# Patient Record
Sex: Female | Born: 1975
Health system: Southern US, Community
[De-identification: ages and names within clinical notes are randomized; demographics above are authoritative.]

## PROBLEM LIST (undated history)

## (undated) DIAGNOSIS — R87619 Unspecified abnormal cytological findings in specimens from cervix uteri: Secondary | ICD-10-CM

## (undated) DIAGNOSIS — G43909 Migraine, unspecified, not intractable, without status migrainosus: Secondary | ICD-10-CM

## (undated) DIAGNOSIS — F419 Anxiety disorder, unspecified: Secondary | ICD-10-CM

## (undated) HISTORY — PX: ANTERIOR CRUCIATE LIGAMENT REPAIR: SHX115

## (undated) HISTORY — DX: Migraine, unspecified, not intractable, without status migrainosus: G43.909

## (undated) HISTORY — DX: Anxiety disorder, unspecified: F41.9

## (undated) HISTORY — DX: Unspecified abnormal cytological findings in specimens from cervix uteri: R87.619

---
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
## 1999-07-01 DIAGNOSIS — R87619 Unspecified abnormal cytological findings in specimens from cervix uteri: Secondary | ICD-10-CM

## 1999-07-01 HISTORY — DX: Unspecified abnormal cytological findings in specimens from cervix uteri: R87.619

## 1999-07-01 HISTORY — PX: GYNECOLOGIC CRYOSURGERY: SHX857

## 2006-12-07 DIAGNOSIS — B360 Pityriasis versicolor: Secondary | ICD-10-CM | POA: Insufficient documentation

## 2011-08-24 ENCOUNTER — Ambulatory Visit (INDEPENDENT_AMBULATORY_CARE_PROVIDER_SITE_OTHER): Payer: 59 | Admitting: Emergency Medicine

## 2011-08-24 VITALS — BP 104/68 | HR 88 | Temp 98.5°F | Resp 12 | Ht 66.0 in | Wt 134.0 lb

## 2011-08-24 DIAGNOSIS — B353 Tinea pedis: Secondary | ICD-10-CM

## 2011-08-24 DIAGNOSIS — R634 Abnormal weight loss: Secondary | ICD-10-CM

## 2011-08-24 DIAGNOSIS — H66019 Acute suppurative otitis media with spontaneous rupture of ear drum, unspecified ear: Secondary | ICD-10-CM

## 2011-08-24 DIAGNOSIS — R5381 Other malaise: Secondary | ICD-10-CM

## 2011-08-24 DIAGNOSIS — B373 Candidiasis of vulva and vagina: Secondary | ICD-10-CM

## 2011-08-24 DIAGNOSIS — B3731 Acute candidiasis of vulva and vagina: Secondary | ICD-10-CM

## 2011-08-24 DIAGNOSIS — H6690 Otitis media, unspecified, unspecified ear: Secondary | ICD-10-CM

## 2011-08-24 MED ORDER — CEFDINIR 300 MG PO CAPS: 300.0000 mg | ORAL_CAPSULE | Freq: Two times a day (BID) | ORAL | Status: AC

## 2011-08-24 MED ORDER — OFLOXACIN 0.3 % OT SOLN: 5.0000 [drp] | Freq: Two times a day (BID) | OTIC | Status: AC

## 2011-08-24 NOTE — Patient Instructions (Signed)

## 2011-08-24 NOTE — Progress Notes (Signed)
  Subjective:    Patient ID: Linda Franklin, female    DOB: 03/17/1976, 36 y.o.   MRN: [id number]  HPI said in mid January she was treated for a strep infection. Following this she has had some upper respiratory type congestion. She subsequently developed left ear pain. Last night she noticed a purulent drainage from her left ear.    Review of Systems specifically she denies cough cardiac or GI symptoms.     Objective:   Physical Exam the right TM is normal the left TM is perforated TM itself is bulging and red. There is a purulent drainage from the left TM.        Assessment & Plan:  Assessment is left otitis media with spontaneous perforation secondary to infection

## 2012-01-21 ENCOUNTER — Ambulatory Visit: Payer: 59 | Admitting: Physician Assistant

## 2012-01-21 ENCOUNTER — Encounter: Payer: Self-pay | Admitting: Physician Assistant

## 2012-01-21 VITALS — BP 98/70 | HR 80 | Temp 98.4°F | Resp 16 | Ht 65.5 in | Wt 131.6 lb

## 2012-01-21 DIAGNOSIS — Z0000 Encounter for general adult medical examination without abnormal findings: Secondary | ICD-10-CM

## 2012-01-21 NOTE — Progress Notes (Signed)
  Subjective:    Patient ID: Linda Franklin, female    DOB: 18-Mar-1976, 36 y.o.   MRN: [id number]  HPI Pt needs form for [facility] filled out.  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  She has had a TB skin test within the last year.  Her CPE was 9/12 and normal and she is having no problems.   Review of Systems     Objective:   Physical Exam        Assessment & Plan:  Pt form was filled out.  No appt was necessary.

## 2012-01-21 NOTE — Progress Notes (Signed)
  Subjective:    Patient ID: Linda Franklin, female    DOB: 10-05-1975, 36 y.o.   MRN: [id number]  HPI    Review of Systems  Constitutional: Negative.   HENT: Negative.   Eyes: Negative.   Respiratory: Negative.   Cardiovascular: Negative.   Gastrointestinal: Negative.   Genitourinary: Negative.   Musculoskeletal: Negative.   Skin: Negative.   Neurological: Negative.   Hematological: Negative.   Psychiatric/Behavioral: Negative.        Objective:   Physical Exam        Assessment & Plan:

## 2012-07-13 ENCOUNTER — Ambulatory Visit (INDEPENDENT_AMBULATORY_CARE_PROVIDER_SITE_OTHER): Payer: BC Managed Care – PPO | Admitting: Family Medicine

## 2012-07-13 ENCOUNTER — Encounter: Payer: Self-pay | Admitting: Family Medicine

## 2012-07-13 VITALS — BP 98/66 | HR 69 | Temp 98.2°F | Resp 16 | Ht 68.75 in | Wt 135.0 lb

## 2012-07-13 DIAGNOSIS — Z0289 Encounter for other administrative examinations: Secondary | ICD-10-CM

## 2012-07-13 DIAGNOSIS — Z0000 Encounter for general adult medical examination without abnormal findings: Secondary | ICD-10-CM | POA: Insufficient documentation

## 2012-07-13 DIAGNOSIS — Z111 Encounter for screening for respiratory tuberculosis: Secondary | ICD-10-CM

## 2012-07-13 LAB — POCT URINALYSIS DIPSTICK
Bilirubin, UA: NEGATIVE
Ketones, UA: NEGATIVE
Leukocytes, UA: NEGATIVE
Nitrite, UA: NEGATIVE
pH, UA: 6

## 2012-07-13 NOTE — Progress Notes (Signed)
  Subjective:    Patient ID: Linda Franklin, female    DOB: [birth year], 37 y.o.   MRN: [id number]  HPI  This healthy 37 y.o. Cauc female is here for [facility] physical exam. 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 No chronic medical issues have been identified. She stays physically active with jogging twice a week. Immunizations are UTD. She has annual vision evaluation because she wears contacts. Last PAP was 2012 (normal).    Review of Systems  Constitutional: Negative.   HENT: Positive for tinnitus.   Eyes: Negative.   Respiratory: Negative.   Cardiovascular: Negative.   Gastrointestinal: Negative.   Genitourinary: Negative.   Musculoskeletal: Negative.   Skin: Negative.   Neurological: Negative.   Hematological: Negative.   Psychiatric/Behavioral: Negative.        Objective:   Physical Exam  Nursing note and vitals reviewed. Constitutional: She is oriented to person, place, and time. Vital signs are normal. She appears well-developed and well-nourished. No distress.  HENT:  Head: Normocephalic and atraumatic.  Right Ear: Hearing, tympanic membrane, external ear and ear canal normal.  Left Ear: Hearing, tympanic membrane, external ear and ear canal normal.  Nose: Nose normal. No nasal deformity or septal deviation.  Mouth/Throat: Uvula is midline, oropharynx is clear and moist and mucous membranes are normal. No oral lesions. Normal dentition. No dental caries.  Eyes: Conjunctivae normal and EOM are normal. Pupils are equal, round, and reactive to light. Right eye exhibits no discharge. Left eye exhibits no discharge. No scleral icterus.  Neck: Normal range of motion. Neck supple. No thyromegaly present.  Cardiovascular: Normal rate, regular rhythm and normal heart sounds.  Exam reveals no gallop and no friction rub.   No murmur heard. Pulmonary/Chest: Effort normal and breath sounds normal.  Abdominal: Soft. Bowel sounds are normal. She exhibits no distension  and no mass. There is no hepatosplenomegaly. There is no tenderness. There is no guarding and no CVA tenderness.  Musculoskeletal: Normal range of motion. She exhibits no edema and no tenderness.  Lymphadenopathy:    She has no cervical adenopathy.  Neurological: She is alert and oriented to person, place, and time. She has normal reflexes. No cranial nerve deficit. She exhibits normal muscle tone. Coordination normal.  Skin: Skin is warm and dry. No rash noted. No erythema. No pallor.  Psychiatric: She has a normal mood and affect. Her behavior is normal. Judgment and thought content normal.    Results for orders placed in visit on 07/13/12  POCT URINALYSIS DIPSTICK      Component Value Range   Color, UA yellow     Clarity, UA clear     Glucose, UA neg     Bilirubin, UA neg     Ketones, UA neg     Spec Grav, UA 1.025     Blood, UA neg     pH, UA 6.0     Protein, UA neg     Urobilinogen, UA 0.2     Nitrite, UA neg     Leukocytes, UA Negative           Assessment & Plan:   1. Other general medical examination for administrative purposes  POCT urinalysis dipstick  2. Screening for tuberculosis  TB Skin Test Pt will return in 48-72 hours to have PPD read.

## 2012-07-13 NOTE — Patient Instructions (Signed)
Your exam was normal today. A PPD (TB skin test) was placed today. You will need to return to [facility] in 48-72 hours to have it looked at; you will receive your form at that time.

## 2012-07-15 ENCOUNTER — Encounter (INDEPENDENT_AMBULATORY_CARE_PROVIDER_SITE_OTHER): Payer: BC Managed Care – PPO

## 2012-07-15 DIAGNOSIS — Z111 Encounter for screening for respiratory tuberculosis: Secondary | ICD-10-CM

## 2012-07-15 LAB — TB SKIN TEST: TB Skin Test: NEGATIVE

## 2013-05-05 ENCOUNTER — Other Ambulatory Visit: Payer: Self-pay

## 2014-07-03 ENCOUNTER — Ambulatory Visit (INDEPENDENT_AMBULATORY_CARE_PROVIDER_SITE_OTHER): Payer: BLUE CROSS/BLUE SHIELD | Admitting: Nurse Practitioner

## 2014-07-03 ENCOUNTER — Encounter: Payer: Self-pay | Admitting: Nurse Practitioner

## 2014-07-03 VITALS — BP 100/60 | HR 68 | Ht 65.5 in | Wt 136.0 lb

## 2014-07-03 DIAGNOSIS — Z01419 Encounter for gynecological examination (general) (routine) without abnormal findings: Secondary | ICD-10-CM

## 2014-07-03 DIAGNOSIS — Z0000 Encounter for general adult medical examination without abnormal findings: Secondary | ICD-10-CM

## 2014-07-03 LAB — POCT URINALYSIS DIPSTICK
Bilirubin, UA: NEGATIVE
GLUCOSE UA: NEGATIVE
Ketones, UA: NEGATIVE
Leukocytes, UA: NEGATIVE
Nitrite, UA: NEGATIVE
PH UA: 5
PROTEIN UA: NEGATIVE
RBC UA: NEGATIVE
UROBILINOGEN UA: NEGATIVE

## 2014-07-03 NOTE — Patient Instructions (Addendum)

## 2014-07-03 NOTE — Progress Notes (Signed)
Patient ID: Linda Franklin, female   DOB: 1975-10-27, 39 y.o.   MRN: [id number] 39 y.o. G64P2002 Married Caucasian Fe here for NGYN annual exam.  Menses are normal last 4 days.  Moderate to light, moderate cramps, some PMS, breast tenderness.  Moved here in 2010 for husbands job.  She is working for [employer].  Occasionally has stress incontinence with jogging.  Patient's last menstrual period was 06/17/2014 (exact date).          Sexually active: Yes.    The current method of family planning is vasectomy.    Exercising: Yes.    Home exercise routine includes yoga and jogging on weekends. Smoker:  no  Health Maintenance: Pap:  2012?, [facility] MMG:  Never  TDaP:  12/17/09 Labs:  HB:  15.2  Urine:  negative   reports that she has never smoked. She has never used smokeless tobacco. She reports that she drinks alcohol. She reports that she does not use illicit drugs.  Past Medical History  Diagnosis Date  . Abnormal Pap smear of cervix 2001    ? HPV, cryo, normal since    Past Surgical History  Procedure Laterality Date  . Anterior cruciate ligament repair Left 2004  . Gynecologic cryosurgery  2001    Cervix, abnormal pap  . Mouth surgery  1990    teeth exraction for brace    No current outpatient prescriptions on file.   No current facility-administered medications for this visit.    Family History  Problem Relation Age of Onset  . Hypertension Mother   . Hyperlipidemia Mother   . Heart disease Maternal Grandfather   . Heart attack Maternal Grandfather   . Cancer Paternal Grandmother     brain  . Alcohol abuse Paternal Grandfather   . Heart failure Paternal Grandfather   . Diabetes Paternal Grandfather   . Diabetes Father     ROS:  Pertinent items are noted in HPI.  Otherwise, a comprehensive ROS was negative.  Exam:   BP 100/60 mmHg  Pulse 68  Ht 5' 5.5" (1.664 m)  Wt 136 lb (61.689 kg)  BMI 22.28 kg/m2  LMP 06/17/2014 (Exact Date) Height: 5' 5.5" (166.4 cm)  Ht Readings from  Last 3 Encounters:  07/03/14 5' 5.5" (1.664 m)  07/13/12 5' 8.75" (1.746 m)  01/21/12 5' 5.5" (1.664 m)    General appearance: alert, cooperative and appears stated age Head: Normocephalic, without obvious abnormality, atraumatic Neck: no adenopathy, supple, symmetrical, trachea midline and thyroid normal to inspection and palpation Lungs: clear to auscultation bilaterally Breasts: normal appearance, no masses or tenderness Heart: regular rate and rhythm Abdomen: soft, non-tender; no masses,  no organomegaly Extremities: extremities normal, atraumatic, no cyanosis or edema Skin: Skin color, texture, turgor normal. No rashes or lesions Lymph nodes: Cervical, supraclavicular, and axillary nodes normal. No abnormal inguinal nodes palpated Neurologic: Grossly normal   Pelvic: External genitalia:  no lesions              Urethra:  normal appearing urethra with no masses, tenderness or lesions              Bartholin's and Skene's: normal                 Vagina: normal appearing vagina with normal color and discharge, no lesions              Cervix: anteverted              Pap taken: Yes.  Bimanual Exam:  Uterus:  normal size, contour, position, consistency, mobility, non-tender              Adnexa: no mass, fullness, tenderness               Rectovaginal: Confirms               Anus:  normal sphincter tone, no lesions  A:  Well Woman with normal exam  Husband with vasectomy  Normal menses  Occasional incontinence with running  Abnormal pap 2001 with cryo for HPV  P:   Reviewed health and wellness pertinent to exam  Pap smear taken today  Counseled on breast self exam, adequate intake of calcium and vitamin D, diet and exercise, Kegel's exercises return annually or prn  An After Visit Summary was printed and given to the patient.  Names of PCP - internal medicine given

## 2014-07-04 LAB — HEMOGLOBIN, FINGERSTICK: HEMOGLOBIN, FINGERSTICK: 15.2 g/dL (ref 12.0–16.0)

## 2014-07-04 NOTE — Progress Notes (Signed)
Encounter reviewed by Dr. Lenward Able Silva.  

## 2014-07-05 LAB — IPS PAP TEST WITH HPV

## 2015-07-09 ENCOUNTER — Ambulatory Visit: Payer: BLUE CROSS/BLUE SHIELD | Admitting: Nurse Practitioner

## 2015-07-30 ENCOUNTER — Telehealth: Payer: Self-pay | Admitting: Nurse Practitioner

## 2015-07-30 NOTE — Telephone Encounter (Signed)
left message for appt/rd

## 2015-07-31 ENCOUNTER — Ambulatory Visit (INDEPENDENT_AMBULATORY_CARE_PROVIDER_SITE_OTHER): Payer: BLUE CROSS/BLUE SHIELD | Admitting: Nurse Practitioner

## 2015-07-31 ENCOUNTER — Encounter: Payer: Self-pay | Admitting: Nurse Practitioner

## 2015-07-31 VITALS — BP 96/60 | HR 64 | Ht 65.5 in | Wt 125.0 lb

## 2015-07-31 DIAGNOSIS — Z0000 Encounter for general adult medical examination without abnormal findings: Secondary | ICD-10-CM

## 2015-07-31 DIAGNOSIS — Z01419 Encounter for gynecological examination (general) (routine) without abnormal findings: Secondary | ICD-10-CM

## 2015-07-31 DIAGNOSIS — N760 Acute vaginitis: Secondary | ICD-10-CM

## 2015-07-31 NOTE — Patient Instructions (Addendum)

## 2015-07-31 NOTE — Progress Notes (Signed)
Encounter reviewed by Dr. Brook Amundson C. Silva.  

## 2015-07-31 NOTE — Progress Notes (Signed)
Patient ID: Linda Franklin, female   DOB: 03/26/1976, 40 y.o.   MRN: [id number]  40 y.o. G3P2002 Married  Caucasian Fe here for annual exam.  Menses last 4 days. Moderate to light with mild cramps.  Some PMS.  Some vaginal discharge that is ongoing and sometimes worse with cycles.  Patient's last menstrual period was 07/04/2015 (exact date).          Sexually active: Yes.    The current method of family planning is vasectomy.    Exercising: Yes.    jogging Smoker:  no  Health Maintenance: Pap:  07/03/14, negative with neg HR HPV TDaP:  6 /20/11  HIV: 07/01/03 (pregnancy) Labs: HB: 15.3   Urine: negative   reports that she has never smoked. She has never used smokeless tobacco. She reports that she drinks alcohol. She reports that she does not use illicit drugs.  Past Medical History  Diagnosis Date  . Abnormal Pap smear of cervix 2001    ? HPV, cryo, normal since    Past Surgical History  Procedure Laterality Date  . Anterior cruciate ligament repair Left 2004  . Gynecologic cryosurgery  2001    Cervix, abnormal pap  . Mouth surgery  1990    teeth exraction for brace    No current outpatient prescriptions on file.   No current facility-administered medications for this visit.    Family History  Problem Relation Age of Onset  . Hypertension Mother   . Hyperlipidemia Mother   . Heart disease Maternal Grandfather   . Heart attack Maternal Grandfather   . Cancer Paternal Grandmother     brain  . Alcohol abuse Paternal Grandfather   . Heart failure Paternal Grandfather   . Diabetes Paternal Grandfather   . Diabetes Father     ROS:  Pertinent items are noted in HPI.  Otherwise, a comprehensive ROS was negative.  Exam:   BP 96/60 mmHg  Pulse 64  Ht 5' 5.5" (1.664 m)  Wt 125 lb (56.7 kg)  BMI 20.48 kg/m2  LMP 07/04/2015 (Exact Date) Height: 5' 5.5" (166.4 cm) Ht Readings from Last 3 Encounters:  07/31/15 5' 5.5" (1.664 m)  07/03/14 5' 5.5" (1.664 m)  07/13/12 5' 8.75"  (1.746 m)    General appearance: alert, cooperative and appears stated age Head: Normocephalic, without obvious abnormality, atraumatic Neck: no adenopathy, supple, symmetrical, trachea midline and thyroid normal to inspection and palpation Lungs: clear to auscultation bilaterally Breasts: normal appearance, no masses or tenderness Heart: regular rate and rhythm Abdomen: soft, non-tender; no masses,  no organomegaly Extremities: extremities normal, atraumatic, no cyanosis or edema Skin: Skin color, texture, turgor normal. No rashes or lesions Lymph nodes: Cervical, supraclavicular, and axillary nodes normal. No abnormal inguinal nodes palpated Neurologic: Grossly normal   Pelvic: External genitalia:  no lesions              Urethra:  normal appearing urethra with no masses, tenderness or lesions              Bartholin's and Skene's: normal                 Vagina: normal appearing vagina with normal color and discharge, no lesions              Cervix: anteverted              Pap taken: No. Bimanual Exam:  Uterus:  normal size, contour, position, consistency, mobility, non-tender  Adnexa: no mass, fullness, tenderness               Rectovaginal: Confirms               Anus:  normal sphincter tone, no lesions  Chaperone present: yes  A:  Well Woman with normal exam  Husband with vasectomy Normal menses Occasional incontinence with running Abnormal pap 2001 with cryo for HPV  R/O vaginitis   P:   Reviewed health and wellness pertinent to exam  Pap smear as above  Will follow with Affirm  Counseled on breast self exam, mammography screening, adequate intake of calcium and vitamin D, diet and exercise return annually or prn  An After Visit Summary was printed and given to the patient.

## 2015-08-01 ENCOUNTER — Other Ambulatory Visit: Payer: Self-pay | Admitting: Nurse Practitioner

## 2015-08-01 LAB — WET PREP BY MOLECULAR PROBE
Candida species: NEGATIVE
GARDNERELLA VAGINALIS: POSITIVE — AB
Trichomonas vaginosis: NEGATIVE

## 2015-08-01 MED ORDER — METRONIDAZOLE 0.75 % VA GEL: 1.0000 | Freq: Every day | VAGINAL | Status: DC

## 2015-08-01 MED ORDER — FLUCONAZOLE 150 MG PO TABS: 150.0000 mg | ORAL_TABLET | Freq: Once | ORAL | Status: DC

## 2015-08-02 LAB — HEMOGLOBIN, FINGERSTICK: Hemoglobin, fingerstick: 15.3 g/dL (ref 12.0–16.0)

## 2015-08-15 ENCOUNTER — Ambulatory Visit (INDEPENDENT_AMBULATORY_CARE_PROVIDER_SITE_OTHER): Payer: BLUE CROSS/BLUE SHIELD | Admitting: Nurse Practitioner

## 2015-08-15 ENCOUNTER — Encounter: Payer: Self-pay | Admitting: Nurse Practitioner

## 2015-08-15 VITALS — BP 114/76 | HR 92 | Temp 97.6°F | Ht 65.5 in | Wt 127.0 lb

## 2015-08-15 DIAGNOSIS — Z113 Encounter for screening for infections with a predominantly sexual mode of transmission: Secondary | ICD-10-CM

## 2015-08-15 DIAGNOSIS — N760 Acute vaginitis: Secondary | ICD-10-CM

## 2015-08-15 MED ORDER — FLUCONAZOLE 150 MG PO TABS: 150.0000 mg | ORAL_TABLET | Freq: Once | ORAL | Status: DC

## 2015-08-15 MED ORDER — NYSTATIN-TRIAMCINOLONE 100000-0.1 UNIT/GM-% EX OINT: 1.0000 "application " | TOPICAL_OINTMENT | Freq: Two times a day (BID) | CUTANEOUS | Status: DC

## 2015-08-15 NOTE — Progress Notes (Signed)
39 y.o. Married Caucasian female G2P2002 here with complaint of vaginal symptoms of itching, burning, and increase discharge. Describes discharge as thick and white. Onset of symptoms 5 days ago. Denies new personal products or vaginal dryness. no STD concerns. Urinary symptoms none . Contraception is vasectomy.  She has recently torn her left meniscus again and will be having surgery 3/ 2017.  In January she had BV and did have a bad yeast infection afterwards that she treated with Diflucan.   O:  Healthy female WDWN Affect: normal, orientation x 3  Exam: alert, some discomfort at the vulva Abdomen: soft and non tender Lymph node: no enlargement or tenderness Pelvic exam: External genital: normal female BUS: negative Vagina: very copious amounts of thick white discharge noted.  Affirm taken. Cervix: normal, non tender, no CMT Uterus: normal, non tender Adnexa:normal, non tender, no masses or fullness noted   A: Vaginitis - most likely yeast  R/O STD's   P: Discussed findings of vaginitis and etiology. Discussed Aveeno or baking soda sitz bath for comfort. Avoid moist clothes or pads for extended period of time. If working out in gym clothes or swim suits for long periods of time change underwear or bottoms of swimsuit if possible. Olive Oil/Coconut Oil use for skin protection prior to activity can be used to external skin.  Rx: Diflucan 150 mg # 2 with refills in case she is on antibiotics after knee surgery upcoming  Will give her Triamcinolone and Nystatin to use BID for discomfort  Follow with Affirm  RV prn

## 2015-08-15 NOTE — Patient Instructions (Signed)

## 2015-08-16 ENCOUNTER — Telehealth: Payer: Self-pay | Admitting: Nurse Practitioner

## 2015-08-16 LAB — WET PREP BY MOLECULAR PROBE
CANDIDA SPECIES: NEGATIVE
Gardnerella vaginalis: NEGATIVE
Trichomonas vaginosis: NEGATIVE

## 2015-08-16 LAB — STD PANEL
HEP B S AG: NEGATIVE
HIV 1&2 Ab, 4th Generation: NONREACTIVE

## 2015-08-16 NOTE — Telephone Encounter (Signed)
Return call to patient. Advised of negative Affirm and HIV, RPR and Hep B testing. See result note from Debbi Darcel Bayley CNM covering for Shirlyn Goltz FNP. Patient  concerned about negative testing for yeast since she is being treated for yeast, concerned that maybe this means she has Gonorrhea or Chlamydia and asking for these results. Advised these test results are still pending. Advised that sometimes contact type reaction can be similar to yeast infection. Has RX for Triamcinolone cream.  Patient very anxious for these results and requests call back ASAP since was expecting these results today.    Routing to Dr Hyacinth Meeker covering for [place] while out of office.  CC: Shirlyn Goltz, FNP

## 2015-08-16 NOTE — Progress Notes (Signed)
Encounter reviewed by Dr. Wandell Scullion Amundson C. Silva.  

## 2015-08-16 NOTE — Telephone Encounter (Signed)
Patient is calling to see if her results are in yet. She is worried because she was told her labs would be back today.

## 2015-08-16 NOTE — Addendum Note (Signed)
Addended by: Jerene Bears on: 08/16/2015 04:39 PM   Modules accepted: Orders

## 2015-08-17 NOTE — Telephone Encounter (Signed)
Pt notified of negative results for Affirm and blood work.  GC/Chl pending.  Order was placed.  Ok to close encounter.

## 2015-08-20 ENCOUNTER — Telehealth: Payer: Self-pay | Admitting: *Deleted

## 2015-08-20 LAB — IPS N GONORRHOEA AND CHLAMYDIA BY PCR

## 2015-08-20 NOTE — Telephone Encounter (Signed)
Since she is still symptomatic have her to take the other Diflucan.

## 2015-08-20 NOTE — Telephone Encounter (Signed)
Notified patient of recommendation from Shirlyn Goltz, FNP.  Pt is agreeable.  She will get refill and call us if needed.

## 2015-08-20 NOTE — Telephone Encounter (Signed)
Patient states she is still having discharge and external vaginal irritation.  Symptoms are improved, but not resolved.  Still has thick discharge.  Pt took Diflucan on 2/15 and 2/17.  Wants to know if she should take second round of Diflucan now or wait until after surgery.  Please advise.

## 2015-09-10 DIAGNOSIS — Z9889 Other specified postprocedural states: Secondary | ICD-10-CM | POA: Insufficient documentation

## 2015-10-01 DIAGNOSIS — M25662 Stiffness of left knee, not elsewhere classified: Secondary | ICD-10-CM | POA: Diagnosis not present

## 2015-10-01 DIAGNOSIS — R269 Unspecified abnormalities of gait and mobility: Secondary | ICD-10-CM | POA: Diagnosis not present

## 2015-10-01 DIAGNOSIS — M25462 Effusion, left knee: Secondary | ICD-10-CM | POA: Diagnosis not present

## 2015-10-01 DIAGNOSIS — Z9889 Other specified postprocedural states: Secondary | ICD-10-CM | POA: Diagnosis not present

## 2015-10-02 DIAGNOSIS — R269 Unspecified abnormalities of gait and mobility: Secondary | ICD-10-CM | POA: Diagnosis not present

## 2015-10-02 DIAGNOSIS — S83212D Bucket-handle tear of medial meniscus, current injury, left knee, subsequent encounter: Secondary | ICD-10-CM | POA: Diagnosis not present

## 2015-10-02 DIAGNOSIS — Z9889 Other specified postprocedural states: Secondary | ICD-10-CM | POA: Diagnosis not present

## 2015-10-02 DIAGNOSIS — M25562 Pain in left knee: Secondary | ICD-10-CM | POA: Diagnosis not present

## 2015-10-09 DIAGNOSIS — S83212D Bucket-handle tear of medial meniscus, current injury, left knee, subsequent encounter: Secondary | ICD-10-CM | POA: Diagnosis not present

## 2015-10-09 DIAGNOSIS — R269 Unspecified abnormalities of gait and mobility: Secondary | ICD-10-CM | POA: Diagnosis not present

## 2015-10-09 DIAGNOSIS — M25562 Pain in left knee: Secondary | ICD-10-CM | POA: Diagnosis not present

## 2015-10-09 DIAGNOSIS — Z9889 Other specified postprocedural states: Secondary | ICD-10-CM | POA: Diagnosis not present

## 2015-10-10 DIAGNOSIS — S83212D Bucket-handle tear of medial meniscus, current injury, left knee, subsequent encounter: Secondary | ICD-10-CM | POA: Diagnosis not present

## 2015-10-10 DIAGNOSIS — R269 Unspecified abnormalities of gait and mobility: Secondary | ICD-10-CM | POA: Diagnosis not present

## 2015-10-10 DIAGNOSIS — Z9889 Other specified postprocedural states: Secondary | ICD-10-CM | POA: Diagnosis not present

## 2015-10-10 DIAGNOSIS — M25562 Pain in left knee: Secondary | ICD-10-CM | POA: Diagnosis not present

## 2015-10-14 DIAGNOSIS — N390 Urinary tract infection, site not specified: Secondary | ICD-10-CM | POA: Diagnosis not present

## 2015-10-16 DIAGNOSIS — M25562 Pain in left knee: Secondary | ICD-10-CM | POA: Diagnosis not present

## 2015-10-16 DIAGNOSIS — R269 Unspecified abnormalities of gait and mobility: Secondary | ICD-10-CM | POA: Diagnosis not present

## 2015-10-16 DIAGNOSIS — S83212D Bucket-handle tear of medial meniscus, current injury, left knee, subsequent encounter: Secondary | ICD-10-CM | POA: Diagnosis not present

## 2015-10-16 DIAGNOSIS — Z9889 Other specified postprocedural states: Secondary | ICD-10-CM | POA: Diagnosis not present

## 2015-10-19 DIAGNOSIS — M25562 Pain in left knee: Secondary | ICD-10-CM | POA: Diagnosis not present

## 2015-10-19 DIAGNOSIS — Z9889 Other specified postprocedural states: Secondary | ICD-10-CM | POA: Diagnosis not present

## 2015-10-19 DIAGNOSIS — R269 Unspecified abnormalities of gait and mobility: Secondary | ICD-10-CM | POA: Diagnosis not present

## 2015-10-19 DIAGNOSIS — S83212D Bucket-handle tear of medial meniscus, current injury, left knee, subsequent encounter: Secondary | ICD-10-CM | POA: Diagnosis not present

## 2015-10-23 DIAGNOSIS — Z9889 Other specified postprocedural states: Secondary | ICD-10-CM | POA: Diagnosis not present

## 2015-10-23 DIAGNOSIS — S83212D Bucket-handle tear of medial meniscus, current injury, left knee, subsequent encounter: Secondary | ICD-10-CM | POA: Diagnosis not present

## 2015-10-23 DIAGNOSIS — R269 Unspecified abnormalities of gait and mobility: Secondary | ICD-10-CM | POA: Diagnosis not present

## 2015-10-23 DIAGNOSIS — M25562 Pain in left knee: Secondary | ICD-10-CM | POA: Diagnosis not present

## 2015-10-26 DIAGNOSIS — S83212D Bucket-handle tear of medial meniscus, current injury, left knee, subsequent encounter: Secondary | ICD-10-CM | POA: Diagnosis not present

## 2015-10-26 DIAGNOSIS — M25562 Pain in left knee: Secondary | ICD-10-CM | POA: Diagnosis not present

## 2015-10-26 DIAGNOSIS — R269 Unspecified abnormalities of gait and mobility: Secondary | ICD-10-CM | POA: Diagnosis not present

## 2015-10-26 DIAGNOSIS — Z9889 Other specified postprocedural states: Secondary | ICD-10-CM | POA: Diagnosis not present

## 2015-10-29 DIAGNOSIS — M25662 Stiffness of left knee, not elsewhere classified: Secondary | ICD-10-CM | POA: Diagnosis not present

## 2015-10-29 DIAGNOSIS — R269 Unspecified abnormalities of gait and mobility: Secondary | ICD-10-CM | POA: Diagnosis not present

## 2015-10-29 DIAGNOSIS — M25462 Effusion, left knee: Secondary | ICD-10-CM | POA: Diagnosis not present

## 2015-10-29 DIAGNOSIS — Z9889 Other specified postprocedural states: Secondary | ICD-10-CM | POA: Diagnosis not present

## 2015-11-02 DIAGNOSIS — S83212D Bucket-handle tear of medial meniscus, current injury, left knee, subsequent encounter: Secondary | ICD-10-CM | POA: Diagnosis not present

## 2015-11-02 DIAGNOSIS — M25662 Stiffness of left knee, not elsewhere classified: Secondary | ICD-10-CM | POA: Diagnosis not present

## 2015-11-02 DIAGNOSIS — R269 Unspecified abnormalities of gait and mobility: Secondary | ICD-10-CM | POA: Diagnosis not present

## 2015-11-02 DIAGNOSIS — M25562 Pain in left knee: Secondary | ICD-10-CM | POA: Diagnosis not present

## 2015-11-06 DIAGNOSIS — S83212D Bucket-handle tear of medial meniscus, current injury, left knee, subsequent encounter: Secondary | ICD-10-CM | POA: Diagnosis not present

## 2015-11-06 DIAGNOSIS — M25662 Stiffness of left knee, not elsewhere classified: Secondary | ICD-10-CM | POA: Diagnosis not present

## 2015-11-06 DIAGNOSIS — R269 Unspecified abnormalities of gait and mobility: Secondary | ICD-10-CM | POA: Diagnosis not present

## 2015-11-06 DIAGNOSIS — M25562 Pain in left knee: Secondary | ICD-10-CM | POA: Diagnosis not present

## 2015-11-08 DIAGNOSIS — R269 Unspecified abnormalities of gait and mobility: Secondary | ICD-10-CM | POA: Diagnosis not present

## 2015-11-08 DIAGNOSIS — S83212D Bucket-handle tear of medial meniscus, current injury, left knee, subsequent encounter: Secondary | ICD-10-CM | POA: Diagnosis not present

## 2015-11-08 DIAGNOSIS — M25662 Stiffness of left knee, not elsewhere classified: Secondary | ICD-10-CM | POA: Diagnosis not present

## 2015-11-08 DIAGNOSIS — M25562 Pain in left knee: Secondary | ICD-10-CM | POA: Diagnosis not present

## 2015-11-15 DIAGNOSIS — R269 Unspecified abnormalities of gait and mobility: Secondary | ICD-10-CM | POA: Diagnosis not present

## 2015-11-15 DIAGNOSIS — M25562 Pain in left knee: Secondary | ICD-10-CM | POA: Diagnosis not present

## 2015-11-15 DIAGNOSIS — M25662 Stiffness of left knee, not elsewhere classified: Secondary | ICD-10-CM | POA: Diagnosis not present

## 2015-11-15 DIAGNOSIS — S83212D Bucket-handle tear of medial meniscus, current injury, left knee, subsequent encounter: Secondary | ICD-10-CM | POA: Diagnosis not present

## 2015-11-20 DIAGNOSIS — R269 Unspecified abnormalities of gait and mobility: Secondary | ICD-10-CM | POA: Diagnosis not present

## 2015-11-20 DIAGNOSIS — M25562 Pain in left knee: Secondary | ICD-10-CM | POA: Diagnosis not present

## 2015-11-20 DIAGNOSIS — M25662 Stiffness of left knee, not elsewhere classified: Secondary | ICD-10-CM | POA: Diagnosis not present

## 2015-11-20 DIAGNOSIS — S83212D Bucket-handle tear of medial meniscus, current injury, left knee, subsequent encounter: Secondary | ICD-10-CM | POA: Diagnosis not present

## 2015-11-23 DIAGNOSIS — R269 Unspecified abnormalities of gait and mobility: Secondary | ICD-10-CM | POA: Diagnosis not present

## 2015-11-23 DIAGNOSIS — M25562 Pain in left knee: Secondary | ICD-10-CM | POA: Diagnosis not present

## 2015-11-23 DIAGNOSIS — M25662 Stiffness of left knee, not elsewhere classified: Secondary | ICD-10-CM | POA: Diagnosis not present

## 2015-11-23 DIAGNOSIS — S83212D Bucket-handle tear of medial meniscus, current injury, left knee, subsequent encounter: Secondary | ICD-10-CM | POA: Diagnosis not present

## 2015-11-27 DIAGNOSIS — M25662 Stiffness of left knee, not elsewhere classified: Secondary | ICD-10-CM | POA: Diagnosis not present

## 2015-11-27 DIAGNOSIS — M25562 Pain in left knee: Secondary | ICD-10-CM | POA: Diagnosis not present

## 2015-11-27 DIAGNOSIS — S83212D Bucket-handle tear of medial meniscus, current injury, left knee, subsequent encounter: Secondary | ICD-10-CM | POA: Diagnosis not present

## 2015-11-27 DIAGNOSIS — R269 Unspecified abnormalities of gait and mobility: Secondary | ICD-10-CM | POA: Diagnosis not present

## 2015-12-04 DIAGNOSIS — M25562 Pain in left knee: Secondary | ICD-10-CM | POA: Diagnosis not present

## 2015-12-04 DIAGNOSIS — S83212D Bucket-handle tear of medial meniscus, current injury, left knee, subsequent encounter: Secondary | ICD-10-CM | POA: Diagnosis not present

## 2015-12-04 DIAGNOSIS — M25662 Stiffness of left knee, not elsewhere classified: Secondary | ICD-10-CM | POA: Diagnosis not present

## 2015-12-04 DIAGNOSIS — R269 Unspecified abnormalities of gait and mobility: Secondary | ICD-10-CM | POA: Diagnosis not present

## 2015-12-06 DIAGNOSIS — M25562 Pain in left knee: Secondary | ICD-10-CM | POA: Diagnosis not present

## 2015-12-06 DIAGNOSIS — S83212D Bucket-handle tear of medial meniscus, current injury, left knee, subsequent encounter: Secondary | ICD-10-CM | POA: Diagnosis not present

## 2015-12-06 DIAGNOSIS — M25662 Stiffness of left knee, not elsewhere classified: Secondary | ICD-10-CM | POA: Diagnosis not present

## 2015-12-06 DIAGNOSIS — R269 Unspecified abnormalities of gait and mobility: Secondary | ICD-10-CM | POA: Diagnosis not present

## 2015-12-11 DIAGNOSIS — R269 Unspecified abnormalities of gait and mobility: Secondary | ICD-10-CM | POA: Diagnosis not present

## 2015-12-11 DIAGNOSIS — S83212D Bucket-handle tear of medial meniscus, current injury, left knee, subsequent encounter: Secondary | ICD-10-CM | POA: Diagnosis not present

## 2015-12-11 DIAGNOSIS — S83512D Sprain of anterior cruciate ligament of left knee, subsequent encounter: Secondary | ICD-10-CM | POA: Diagnosis not present

## 2015-12-11 DIAGNOSIS — M25662 Stiffness of left knee, not elsewhere classified: Secondary | ICD-10-CM | POA: Diagnosis not present

## 2015-12-13 DIAGNOSIS — R269 Unspecified abnormalities of gait and mobility: Secondary | ICD-10-CM | POA: Diagnosis not present

## 2015-12-13 DIAGNOSIS — S83512D Sprain of anterior cruciate ligament of left knee, subsequent encounter: Secondary | ICD-10-CM | POA: Diagnosis not present

## 2015-12-13 DIAGNOSIS — M25662 Stiffness of left knee, not elsewhere classified: Secondary | ICD-10-CM | POA: Diagnosis not present

## 2015-12-13 DIAGNOSIS — S83212D Bucket-handle tear of medial meniscus, current injury, left knee, subsequent encounter: Secondary | ICD-10-CM | POA: Diagnosis not present

## 2015-12-18 DIAGNOSIS — S83212D Bucket-handle tear of medial meniscus, current injury, left knee, subsequent encounter: Secondary | ICD-10-CM | POA: Diagnosis not present

## 2015-12-18 DIAGNOSIS — M25662 Stiffness of left knee, not elsewhere classified: Secondary | ICD-10-CM | POA: Diagnosis not present

## 2015-12-18 DIAGNOSIS — R269 Unspecified abnormalities of gait and mobility: Secondary | ICD-10-CM | POA: Diagnosis not present

## 2015-12-18 DIAGNOSIS — S83512D Sprain of anterior cruciate ligament of left knee, subsequent encounter: Secondary | ICD-10-CM | POA: Diagnosis not present

## 2015-12-23 ENCOUNTER — Encounter (HOSPITAL_BASED_OUTPATIENT_CLINIC_OR_DEPARTMENT_OTHER): Payer: Self-pay | Admitting: Emergency Medicine

## 2015-12-23 ENCOUNTER — Emergency Department (HOSPITAL_BASED_OUTPATIENT_CLINIC_OR_DEPARTMENT_OTHER)
Admission: EM | Admit: 2015-12-23 | Discharge: 2015-12-23 | Disposition: A | Discharge status: Home / Self Care | Payer: BLUE CROSS/BLUE SHIELD | Attending: Emergency Medicine | Admitting: Emergency Medicine

## 2015-12-23 DIAGNOSIS — S99922A Unspecified injury of left foot, initial encounter: Secondary | ICD-10-CM | POA: Insufficient documentation

## 2015-12-23 DIAGNOSIS — Y9289 Other specified places as the place of occurrence of the external cause: Secondary | ICD-10-CM | POA: Insufficient documentation

## 2015-12-23 DIAGNOSIS — Y999 Unspecified external cause status: Secondary | ICD-10-CM | POA: Insufficient documentation

## 2015-12-23 DIAGNOSIS — Z5321 Procedure and treatment not carried out due to patient leaving prior to being seen by health care provider: Secondary | ICD-10-CM | POA: Insufficient documentation

## 2015-12-23 DIAGNOSIS — W228XXA Striking against or struck by other objects, initial encounter: Secondary | ICD-10-CM | POA: Diagnosis not present

## 2015-12-23 DIAGNOSIS — Y939 Activity, unspecified: Secondary | ICD-10-CM | POA: Insufficient documentation

## 2015-12-23 NOTE — ED Notes (Signed)
Patient reports that she [place] and now has a red streak to her left foot and heel

## 2015-12-23 NOTE — ED Notes (Addendum)
Pt called RN to bedside. When she removed her shoe for examination, she realized that the red mark on her foot has resolved on its own. No marks noted by this RN. Also, no swelling and foot, ankle, lower leg is not warm to the touch. Pt denies pain. She sts that she would like to leave and f/u with her primary care. Pt advised to return to the ED if her symptoms return. Pt verbalizes understanding. Pt CAOx4, in NAD, and ambulated out of the ER unassisted with a quick, steady gait. Pt signature obtained. Pt did not see provider.

## 2015-12-23 NOTE — ED Provider Notes (Signed)
9:44 PM I went she wants to leave. She does not want to be evaluated because she states whatever redness was there is now gone. She left without being seen. Overall appears well but I did not examine patient.  Pricilla LovelessScott Neomi Laidler, MD 12/23/15 2145

## 2015-12-25 DIAGNOSIS — R269 Unspecified abnormalities of gait and mobility: Secondary | ICD-10-CM | POA: Diagnosis not present

## 2015-12-25 DIAGNOSIS — S83512D Sprain of anterior cruciate ligament of left knee, subsequent encounter: Secondary | ICD-10-CM | POA: Diagnosis not present

## 2015-12-25 DIAGNOSIS — S83212D Bucket-handle tear of medial meniscus, current injury, left knee, subsequent encounter: Secondary | ICD-10-CM | POA: Diagnosis not present

## 2015-12-25 DIAGNOSIS — M25662 Stiffness of left knee, not elsewhere classified: Secondary | ICD-10-CM | POA: Diagnosis not present

## 2016-01-04 DIAGNOSIS — S83512D Sprain of anterior cruciate ligament of left knee, subsequent encounter: Secondary | ICD-10-CM | POA: Diagnosis not present

## 2016-01-04 DIAGNOSIS — R269 Unspecified abnormalities of gait and mobility: Secondary | ICD-10-CM | POA: Diagnosis not present

## 2016-01-04 DIAGNOSIS — M25662 Stiffness of left knee, not elsewhere classified: Secondary | ICD-10-CM | POA: Diagnosis not present

## 2016-01-04 DIAGNOSIS — S83212D Bucket-handle tear of medial meniscus, current injury, left knee, subsequent encounter: Secondary | ICD-10-CM | POA: Diagnosis not present

## 2016-01-17 DIAGNOSIS — R269 Unspecified abnormalities of gait and mobility: Secondary | ICD-10-CM | POA: Diagnosis not present

## 2016-01-17 DIAGNOSIS — Z9889 Other specified postprocedural states: Secondary | ICD-10-CM | POA: Diagnosis not present

## 2016-01-17 DIAGNOSIS — M25462 Effusion, left knee: Secondary | ICD-10-CM | POA: Diagnosis not present

## 2016-01-17 DIAGNOSIS — M25662 Stiffness of left knee, not elsewhere classified: Secondary | ICD-10-CM | POA: Diagnosis not present

## 2016-01-22 DIAGNOSIS — R269 Unspecified abnormalities of gait and mobility: Secondary | ICD-10-CM | POA: Diagnosis not present

## 2016-01-22 DIAGNOSIS — M25562 Pain in left knee: Secondary | ICD-10-CM | POA: Diagnosis not present

## 2016-01-22 DIAGNOSIS — M25662 Stiffness of left knee, not elsewhere classified: Secondary | ICD-10-CM | POA: Diagnosis not present

## 2016-01-22 DIAGNOSIS — S83212D Bucket-handle tear of medial meniscus, current injury, left knee, subsequent encounter: Secondary | ICD-10-CM | POA: Diagnosis not present

## 2016-02-05 DIAGNOSIS — N390 Urinary tract infection, site not specified: Secondary | ICD-10-CM | POA: Diagnosis not present

## 2016-02-26 DIAGNOSIS — Z9889 Other specified postprocedural states: Secondary | ICD-10-CM | POA: Diagnosis not present

## 2016-02-26 DIAGNOSIS — Z4889 Encounter for other specified surgical aftercare: Secondary | ICD-10-CM | POA: Diagnosis not present

## 2016-06-02 DIAGNOSIS — S83212D Bucket-handle tear of medial meniscus, current injury, left knee, subsequent encounter: Secondary | ICD-10-CM | POA: Diagnosis not present

## 2016-08-01 ENCOUNTER — Ambulatory Visit (INDEPENDENT_AMBULATORY_CARE_PROVIDER_SITE_OTHER): Payer: BLUE CROSS/BLUE SHIELD | Admitting: Nurse Practitioner

## 2016-08-01 ENCOUNTER — Encounter: Payer: Self-pay | Admitting: Nurse Practitioner

## 2016-08-01 VITALS — BP 100/64 | HR 56 | Ht 65.5 in | Wt 131.0 lb

## 2016-08-01 DIAGNOSIS — Z01419 Encounter for gynecological examination (general) (routine) without abnormal findings: Secondary | ICD-10-CM | POA: Diagnosis not present

## 2016-08-01 DIAGNOSIS — Z0000 Encounter for general adult medical examination without abnormal findings: Secondary | ICD-10-CM | POA: Diagnosis not present

## 2016-08-01 DIAGNOSIS — Z124 Encounter for screening for malignant neoplasm of cervix: Secondary | ICD-10-CM | POA: Diagnosis not present

## 2016-08-01 DIAGNOSIS — Z1151 Encounter for screening for human papillomavirus (HPV): Secondary | ICD-10-CM | POA: Diagnosis not present

## 2016-08-01 LAB — POCT URINALYSIS DIPSTICK
BILIRUBIN UA: NEGATIVE
Blood, UA: NEGATIVE
Glucose, UA: NEGATIVE
KETONES UA: NEGATIVE
Leukocytes, UA: NEGATIVE
Nitrite, UA: NEGATIVE
PH UA: 5
Protein, UA: NEGATIVE
Urobilinogen, UA: NEGATIVE

## 2016-08-01 NOTE — Progress Notes (Signed)
Encounter reviewed by Dr. Enis Riecke Amundson C. Silva.  

## 2016-08-01 NOTE — Progress Notes (Addendum)
Patient ID: Linda RavelingJill Franklin, female   DOB: 05/28/1976, 41 y.o.   MRN: [id number]  41 y.o. 82P2002 Married  Caucasian Fe here for annual exam.  Menses X 4 days.  Some cramps X 1 day. Does not need OTC NSAID's unless HA.  Left knee surgery March with repair of meniscus and ACL graft.  Original injury was a  soccer injury yrs ago.  No new problems.  2 UTI this year.  No recent vaginitis symptoms.    Patient's last menstrual period was 07/25/2016 (exact date).          Sexually active: Yes.    The current method of family planning is vasectomy.    Exercising: Yes.    Home exercise routine includes jogging 3 miles 3-4 times per week in good weather, 1-2 x per week in colder weather.. Smoker:  no  Health Maintenance: Pap: 07/03/14, Negative with neg HR HPV  03/10/11, Negative MMG: Never TDaP:  6 /20/11            HIV: 08/15/15 Labs: HB: declined  Urine: Negative   reports that she has never smoked. She has never used smokeless tobacco. She reports that she drinks alcohol. She reports that she does not use drugs.  Past Medical History:  Diagnosis Date  . Abnormal Pap smear of cervix 2001   ? HPV, cryo, normal since    Past Surgical History:  Procedure Laterality Date  . ANTERIOR CRUCIATE LIGAMENT REPAIR Left 2004  . GYNECOLOGIC CRYOSURGERY  2001   Cervix, abnormal pap  . MOUTH SURGERY  1990   teeth exraction for brace    No current outpatient prescriptions on file.   No current facility-administered medications for this visit.     Family History  Problem Relation Age of Onset  . Hypertension Mother   . Hyperlipidemia Mother   . Heart disease Maternal Grandfather   . Heart attack Maternal Grandfather   . Cancer Paternal Grandmother     brain  . Alcohol abuse Paternal Grandfather   . Heart failure Paternal Grandfather   . Diabetes Paternal Grandfather   . Diabetes Father     ROS:  Pertinent items are noted in HPI.  Otherwise, a comprehensive ROS was negative.  Exam:   BP 100/64  (BP Location: Right Arm, Patient Position: Sitting, Cuff Size: Normal)   Pulse (!) 56   Ht 5' 5.5" (1.664 m)   Wt 131 lb (59.4 kg)   LMP 07/25/2016 (Exact Date)   BMI 21.47 kg/m  Height: 5' 5.5" (166.4 cm) Ht Readings from Last 3 Encounters:  08/01/16 5' 5.5" (1.664 m)  12/23/15 5\' 5"  (1.651 m)  08/15/15 5' 5.5" (1.664 m)    General appearance: alert, cooperative and appears stated age Head: Normocephalic, without obvious abnormality, atraumatic Neck: no adenopathy, supple, symmetrical, trachea midline and thyroid normal to inspection and palpation Lungs: clear to auscultation bilaterally Breasts: normal appearance, no masses or tenderness Heart: regular rate and rhythm Abdomen: soft, non-tender; no masses,  no organomegaly Extremities: extremities normal, atraumatic, no cyanosis or edema Skin: Skin color, texture, turgor normal. No rashes or lesions Lymph nodes: Cervical, supraclavicular, and axillary nodes normal. No abnormal inguinal nodes palpated Neurologic: Grossly normal   Pelvic: External genitalia:  no lesions              Urethra:  normal appearing urethra with no masses, tenderness or lesions              Bartholin's and Skene's: normal  Vagina: normal appearing vagina with normal color and discharge, no lesions              Cervix: anteverted              Pap taken: Yes.   Bimanual Exam:  Uterus:  normal size, contour, position, consistency, mobility, non-tender              Adnexa: no mass, fullness, tenderness               Rectovaginal: Confirms               Anus:  normal sphincter tone, no lesions  Chaperone present: yes  A:  Well Woman with normal exam  Husband with vasectomy Normal menses Occasional incontinence with running Abnormal pap 2001 with cryo for HPV               P:   Reviewed health and wellness pertinent to exam  Pap smear was done  Mammogram - will schedule this yr  Follow with  labs  Counseled on breast self exam, mammography screening, adequate intake of calcium and vitamin D, diet and exercise return annually or prn  An After Visit Summary was printed and given to the patient.

## 2016-08-01 NOTE — Patient Instructions (Signed)

## 2016-08-02 LAB — HIV ANTIBODY (ROUTINE TESTING W REFLEX): HIV 1&2 Ab, 4th Generation: NONREACTIVE

## 2016-08-06 LAB — IPS PAP TEST WITH HPV

## 2017-01-20 ENCOUNTER — Telehealth: Payer: Self-pay | Admitting: *Deleted

## 2017-01-20 NOTE — Telephone Encounter (Signed)
Left message on voicemail to call and reschedule cancelled appointment. Mail letter °

## 2017-02-02 ENCOUNTER — Ambulatory Visit (INDEPENDENT_AMBULATORY_CARE_PROVIDER_SITE_OTHER): Payer: BLUE CROSS/BLUE SHIELD | Admitting: Physician Assistant

## 2017-02-02 ENCOUNTER — Encounter: Payer: Self-pay | Admitting: Physician Assistant

## 2017-02-02 VITALS — BP 100/64 | HR 58 | Temp 97.8°F | Ht 66.0 in | Wt 132.4 lb

## 2017-02-02 DIAGNOSIS — Z1231 Encounter for screening mammogram for malignant neoplasm of breast: Secondary | ICD-10-CM

## 2017-02-02 DIAGNOSIS — L309 Dermatitis, unspecified: Secondary | ICD-10-CM | POA: Diagnosis not present

## 2017-02-02 MED ORDER — TRIAMCINOLONE ACETONIDE 0.5 % EX OINT: 1.0000 "application " | TOPICAL_OINTMENT | Freq: Two times a day (BID) | CUTANEOUS | 0 refills | Status: DC

## 2017-02-02 NOTE — Patient Instructions (Signed)
It was great to meet you, welcome to [place]!  Use the steroid cream twice a day, let us know if the rash worsens or does not improve.   Contact Dermatitis Dermatitis is redness, soreness, and swelling (inflammation) of the skin. Contact dermatitis is a reaction to certain substances that touch the skin. There are two types of contact dermatitis:  Irritant contact dermatitis. This type is caused by something that irritates your skin, such as dry hands from washing them too much. This type does not require previous exposure to the substance for a reaction to occur. This type is more common.  Allergic contact dermatitis. This type is caused by a substance that you are allergic to, such as a nickel allergy or poison ivy. This type only occurs if you have been exposed to the substance (allergen) before. Upon a repeat exposure, your body reacts to the substance. This type is less common.  What are the causes? Many different substances can cause contact dermatitis. Irritant contact dermatitis is most commonly caused by exposure to:  Makeup.  Soaps.  Detergents.  Bleaches.  Acids.  Metal salts, such as nickel.  Allergic contact dermatitis is most commonly caused by exposure to:  Poisonous plants.  Chemicals.  Jewelry.  Latex.  Medicines.  Preservatives in products, such as clothing.  What increases the risk? This condition is more likely to develop in:  People who have jobs that expose them to irritants or allergens.  People who have certain medical conditions, such as asthma or eczema.  What are the signs or symptoms? Symptoms of this condition may occur anywhere on your body where the irritant has touched you or is touched by you. Symptoms include:  Dryness or flaking.  Redness.  Cracks.  Itching.  Pain or a burning feeling.  Blisters.  Drainage of small amounts of blood or clear fluid from skin cracks.  With allergic contact dermatitis, there may also be  swelling in areas such as the eyelids, mouth, or genitals. How is this diagnosed? This condition is diagnosed with a medical history and physical exam. A patch skin test may be performed to help determine the cause. If the condition is related to your job, you may need to see an occupational medicine specialist. How is this treated? Treatment for this condition includes figuring out what caused the reaction and protecting your skin from further contact. Treatment may also include:  Steroid creams or ointments. Oral steroid medicines may be needed in more severe cases.  Antibiotics or antibacterial ointments, if a skin infection is present.  Antihistamine lotion or an antihistamine taken by mouth to ease itching.  A bandage (dressing).  Follow these instructions at home: Skin Care  Moisturize your skin as needed.  Apply cool compresses to the affected areas.  Try taking a bath with: ? Epsom salts. Follow the instructions on the packaging. You can get these at your local pharmacy or grocery store. ? Baking soda. Pour a small amount into the bath as directed by your health care provider. ? Colloidal oatmeal. Follow the instructions on the packaging. You can get this at your local pharmacy or grocery store.  Try applying baking soda paste to your skin. Stir water into baking soda until it reaches a paste-like consistency.  Do not scratch your skin.  Bathe less frequently, such as every other day.  Bathe in lukewarm water. Avoid using hot water. Medicines  Take or apply over-the-counter and prescription medicines only as told by your health care provider.  If you were prescribed an antibiotic medicine, take or apply your antibiotic as told by your health care provider. Do not stop using the antibiotic even if your condition starts to improve. General instructions  Keep all follow-up visits as told by your health care provider. This is important.  Avoid the substance that caused  your reaction. If you do not know what caused it, keep a journal to try to track what caused it. Write down: ? What you eat. ? What cosmetic products you use. ? What you drink. ? What you wear in the affected area. This includes jewelry.  If you were given a dressing, take care of it as told by your health care provider. This includes when to change and remove it. Contact a health care provider if:  Your condition does not improve with treatment.  Your condition gets worse.  You have signs of infection such as swelling, tenderness, redness, soreness, or warmth in the affected area.  You have a fever.  You have new symptoms. Get help right away if:  You have a severe headache, neck pain, or neck stiffness.  You vomit.  You feel very sleepy.  You notice red streaks coming from the affected area.  Your bone or joint underneath the affected area becomes painful after the skin has healed.  The affected area turns darker.  You have difficulty breathing. This information is not intended to replace advice given to you by your health care provider. Make sure you discuss any questions you have with your health care provider. Document Released: 06/13/2000 Document Revised: 11/22/2015 Document Reviewed: 11/01/2014 Elsevier Interactive Patient Education  2018 [employer]

## 2017-02-02 NOTE — Progress Notes (Addendum)
Linda Franklin is a 41 y.o. female here for a new problem and to establish care.    History of Present Illness:   Chief Complaint  Patient presents with  . Rash    had rash for over a month- it is red itchy raised but not weepy and has not spread anywhere else    HPI   Rash -- patient reports a erythematous, raised rash to L shin x 1 month. Tried Benadryl anti-itch a few weeks ago but this did not help. Denies changes to her environment, new pets, new detergents, or contacts with similar rashes. Denies fevers, night sweats, swollen lymph nodes. Does spend a lot of time in wooded areas and suspects possible poison oak but she is unsure. Denies tick bites. Rash has not really changed in appearance since it first appeared.  Health Maintenance Last PAP was in Jan or Feb 2018. Hx of abnormal at age 21. Her Ob-Gyn provider is retiring, she would like to transfer care to here. Would like a screening mammogram done, recent friend just dx with breast cancer.  Other Providers [facility] -- Ria Comment, NP -- retiring Orthopedist, knee surgery -- Dr. Eula Flax   Past Medical History:  Diagnosis Date  . Abnormal Pap smear of cervix 2001   ? HPV, cryo, normal since     Social History   Social History  . Marital status: Married    Spouse name: N/A  . Number of children: N/A  . Years of education: N/A   Occupational History  . teacher    Social History Main Topics  . Smoking status: Never Smoker  . Smokeless tobacco: Never Used  . Alcohol use Yes     Comment: occ wine  . Drug use: No  . Sexual activity: Yes    Birth control/ protection: Surgical     Comment: vasectomy   Other Topics Concern  . Not on file   Social History Narrative   [employer] -- works for the [employer]   Married   2 kiddos   Fun: love to read and watch movies, good friend groups    Past Surgical History:  Procedure Laterality Date  . ANTERIOR CRUCIATE LIGAMENT REPAIR Left  2004, 08/2015  . GYNECOLOGIC CRYOSURGERY  2001   Cervix, abnormal pap  . MOUTH SURGERY  1990   teeth exraction for brace    Family History  Problem Relation Age of Onset  . Hypertension Mother   . Hyperlipidemia Mother   . Heart disease Maternal Grandfather   . Heart attack Maternal Grandfather   . Cancer Paternal Grandmother        brain  . Alcohol abuse Paternal Grandfather   . Heart failure Paternal Grandfather   . Diabetes Paternal Grandfather   . Diabetes Father        Pre-DM  . Breast cancer Neg Hx   . Colon cancer Neg Hx     No Known Allergies  Current Medications:   Current Outpatient Prescriptions:  .  triamcinolone ointment (KENALOG) 0.5 %, Apply 1 application topically 2 (two) times daily., Disp: 30 g, Rfl: 0   Review of Systems:   Review of Systems  Constitutional: Negative for chills, fever, malaise/fatigue and weight loss.  Respiratory: Negative for cough and sputum production.   Cardiovascular: Negative for chest pain, palpitations and leg swelling.  Gastrointestinal: Negative for abdominal pain, heartburn, nausea and vomiting.  Musculoskeletal: Negative for back pain, myalgias and neck pain.  Skin: Positive for itching  and rash.  Neurological: Negative for dizziness and headaches.    Vitals:   Vitals:   02/02/17 1504  BP: 100/64  Pulse: (!) 58  Temp: 97.8 F (36.6 C)  SpO2: 98%  Weight: 132 lb 6.4 oz (60.1 kg)  Height: 5\' 6"  (1.676 m)     Body mass index is 21.37 kg/m.  Physical Exam:   Physical Exam  Constitutional: She appears well-developed. She is cooperative.  Non-toxic appearance. She does not have a sickly appearance. She does not appear ill. No distress.  Cardiovascular: Normal rate, regular rhythm, S1 normal, S2 normal, normal heart sounds and normal pulses.   No LE edema  Pulmonary/Chest: Effort normal and breath sounds normal.  Lymphadenopathy:    She has no cervical adenopathy.  Neurological: She is alert. GCS eye subscore  is 4. GCS verbal subscore is 5. GCS motor subscore is 6.  Skin: Skin is warm, dry and intact.  9 cm x 6 cm localized sandpaper-like erythematous rash to L anterior shin, no draining/oozing/tenderness to palpation  Psychiatric: She has a normal mood and affect. Her speech is normal and behavior is normal.  Nursing note and vitals reviewed.   Assessment and Plan:    Linda Franklin was seen today for rash.  Diagnoses and all orders for this visit:  Dermatitis Rash appears consistent with dermatitis - treat with kenalog per orders. No signs of infection evident on today's exam. Follow-up if symptoms worsen or do not improve.   Screening mammogram, encounter for Will order. Discussed [facility], she is agreeable to go there. -     MM Digital Screening; Future  Other orders -     triamcinolone ointment (KENALOG) 0.5 %; Apply 1 application topically 2 (two) times daily.   . Reviewed expectations re: course of current medical issues. . Discussed self-management of symptoms. . Outlined signs and symptoms indicating need for more acute intervention. . Patient verbalized understanding and all questions were answered. . See orders for this visit as documented in the electronic medical record. . Patient received an After-Visit Summary.  CMA or LPN served as scribe during this visit. History, Physical, and Plan performed by medical provider. Documentation and orders reviewed and attested to.  Jarold MottoSamantha Zebadiah Willert, PA-C

## 2017-02-12 ENCOUNTER — Ambulatory Visit
Admission: RE | Admit: 2017-02-12 | Discharge: 2017-02-12 | Disposition: A | Discharge status: Home / Self Care | Payer: BLUE CROSS/BLUE SHIELD | Source: Ambulatory Visit | Attending: Physician Assistant | Admitting: Physician Assistant

## 2017-02-12 DIAGNOSIS — Z1231 Encounter for screening mammogram for malignant neoplasm of breast: Secondary | ICD-10-CM | POA: Diagnosis not present

## 2017-02-17 ENCOUNTER — Telehealth: Payer: Self-pay | Admitting: Physician Assistant

## 2017-02-17 NOTE — Telephone Encounter (Signed)
ROI faxed to [facility]

## 2017-05-27 DIAGNOSIS — H5213 Myopia, bilateral: Secondary | ICD-10-CM | POA: Diagnosis not present

## 2017-08-03 ENCOUNTER — Ambulatory Visit: Payer: BLUE CROSS/BLUE SHIELD | Admitting: Nurse Practitioner

## 2017-09-11 ENCOUNTER — Telehealth: Payer: Self-pay

## 2017-09-11 NOTE — Telephone Encounter (Signed)
Left message on voice mail to call office back.

## 2017-12-30 ENCOUNTER — Encounter: Payer: Self-pay | Admitting: *Deleted

## 2018-01-01 ENCOUNTER — Other Ambulatory Visit: Payer: Self-pay | Admitting: Physician Assistant

## 2018-01-01 DIAGNOSIS — Z1231 Encounter for screening mammogram for malignant neoplasm of breast: Secondary | ICD-10-CM

## 2018-01-06 ENCOUNTER — Ambulatory Visit (INDEPENDENT_AMBULATORY_CARE_PROVIDER_SITE_OTHER): Payer: BLUE CROSS/BLUE SHIELD | Admitting: Physician Assistant

## 2018-01-06 ENCOUNTER — Encounter: Payer: Self-pay | Admitting: Physician Assistant

## 2018-01-06 VITALS — BP 110/72 | HR 71 | Temp 98.2°F | Ht 65.5 in | Wt 130.0 lb

## 2018-01-06 DIAGNOSIS — Z13220 Encounter for screening for lipoid disorders: Secondary | ICD-10-CM | POA: Diagnosis not present

## 2018-01-06 DIAGNOSIS — Z136 Encounter for screening for cardiovascular disorders: Secondary | ICD-10-CM | POA: Diagnosis not present

## 2018-01-06 DIAGNOSIS — Z0000 Encounter for general adult medical examination without abnormal findings: Secondary | ICD-10-CM | POA: Diagnosis not present

## 2018-01-06 NOTE — Patient Instructions (Signed)
It was great to see you!  Please schedule an appointment at your convenience with your dermatologist for a skin check.  Please consider scheduling your daughter with Dr. Briscoe Deutscher or Dr. Orma Flaming here at our office.  We will contact you when your labs return.  Health Maintenance, Female Adopting a healthy lifestyle and getting preventive care can go a long way to promote health and wellness. Talk with your health care provider about what schedule of regular examinations is right for you. This is a good chance for you to check in with your provider about disease prevention and staying healthy. In between checkups, there are plenty of things you can do on your own. Experts have done a lot of research about which lifestyle changes and preventive measures are most likely to keep you healthy. Ask your health care provider for more information. Weight and diet Eat a healthy diet  Be sure to include plenty of vegetables, fruits, low-fat dairy products, and lean protein.  Do not eat a lot of foods high in solid fats, added sugars, or salt.  Get regular exercise. This is one of the most important things you can do for your health. ? Most adults should exercise for at least 150 minutes each week. The exercise should increase your heart rate and make you sweat (moderate-intensity exercise). ? Most adults should also do strengthening exercises at least twice a week. This is in addition to the moderate-intensity exercise.  Maintain a healthy weight  Body mass index (BMI) is a measurement that can be used to identify possible weight problems. It estimates body fat based on height and weight. Your health care provider can help determine your BMI and help you achieve or maintain a healthy weight.  For females 14 years of age and older: ? A BMI below 18.5 is considered underweight. ? A BMI of 18.5 to 24.9 is normal. ? A BMI of 25 to 29.9 is considered overweight. ? A BMI of 30 and above is  considered obese.  Watch levels of cholesterol and blood lipids  You should start having your blood tested for lipids and cholesterol at 42 years of age, then have this test every 5 years.  You may need to have your cholesterol levels checked more often if: ? Your lipid or cholesterol levels are high. ? You are older than 42 years of age. ? You are at high risk for heart disease.  Cancer screening Lung Cancer  Lung cancer screening is recommended for adults 6-60 years old who are at high risk for lung cancer because of a history of smoking.  A yearly low-dose CT scan of the lungs is recommended for people who: ? Currently smoke. ? Have quit within the past 15 years. ? Have at least a 30-pack-year history of smoking. A pack year is smoking an average of one pack of cigarettes a day for 1 year.  Yearly screening should continue until it has been 15 years since you quit.  Yearly screening should stop if you develop a health problem that would prevent you from having lung cancer treatment.  Breast Cancer  Practice breast self-awareness. This means understanding how your breasts normally appear and feel.  It also means doing regular breast self-exams. Let your health care provider know about any changes, no matter how small.  If you are in your 20s or 30s, you should have a clinical breast exam (CBE) by a health care provider every 1-3 years as part of a regular  health exam.  If you are 40 or older, have a CBE every year. Also consider having a breast X-ray (mammogram) every year.  If you have a family history of breast cancer, talk to your health care provider about genetic screening.  If you are at high risk for breast cancer, talk to your health care provider about having an MRI and a mammogram every year.  Breast cancer gene (BRCA) assessment is recommended for women who have family members with BRCA-related cancers. BRCA-related cancers  include: ? Breast. ? Ovarian. ? Tubal. ? Peritoneal cancers.  Results of the assessment will determine the need for genetic counseling and BRCA1 and BRCA2 testing.  Cervical Cancer Your health care provider may recommend that you be screened regularly for cancer of the pelvic organs (ovaries, uterus, and vagina). This screening involves a pelvic examination, including checking for microscopic changes to the surface of your cervix (Pap test). You may be encouraged to have this screening done every 3 years, beginning at age 21.  For women ages 30-65, health care providers may recommend pelvic exams and Pap testing every 3 years, or they may recommend the Pap and pelvic exam, combined with testing for human papilloma virus (HPV), every 5 years. Some types of HPV increase your risk of cervical cancer. Testing for HPV may also be done on women of any age with unclear Pap test results.  Other health care providers may not recommend any screening for nonpregnant women who are considered low risk for pelvic cancer and who do not have symptoms. Ask your health care provider if a screening pelvic exam is right for you.  If you have had past treatment for cervical cancer or a condition that could lead to cancer, you need Pap tests and screening for cancer for at least 20 years after your treatment. If Pap tests have been discontinued, your risk factors (such as having a new sexual partner) need to be reassessed to determine if screening should resume. Some women have medical problems that increase the chance of getting cervical cancer. In these cases, your health care provider may recommend more frequent screening and Pap tests.  Colorectal Cancer  This type of cancer can be detected and often prevented.  Routine colorectal cancer screening usually begins at 42 years of age and continues through 42 years of age.  Your health care provider may recommend screening at an earlier age if you have risk factors  for colon cancer.  Your health care provider may also recommend using home test kits to check for hidden blood in the stool.  A small camera at the end of a tube can be used to examine your colon directly (sigmoidoscopy or colonoscopy). This is done to check for the earliest forms of colorectal cancer.  Routine screening usually begins at age 50.  Direct examination of the colon should be repeated every 5-10 years through 42 years of age. However, you may need to be screened more often if early forms of precancerous polyps or small growths are found.  Skin Cancer  Check your skin from head to toe regularly.  Tell your health care provider about any new moles or changes in moles, especially if there is a change in a mole's shape or color.  Also tell your health care provider if you have a mole that is larger than the size of a pencil eraser.  Always use sunscreen. Apply sunscreen liberally and repeatedly throughout the day.  Protect yourself by wearing long sleeves, pants, a   wide-brimmed hat, and sunglasses whenever you are outside.  Heart disease, diabetes, and high blood pressure  High blood pressure causes heart disease and increases the risk of stroke. High blood pressure is more likely to develop in: ? People who have blood pressure in the high end of the normal range (130-139/85-89 mm Hg). ? People who are overweight or obese. ? People who are African American.  If you are 54-30 years of age, have your blood pressure checked every 3-5 years. If you are 3 years of age or older, have your blood pressure checked every year. You should have your blood pressure measured twice-once when you are at a hospital or clinic, and once when you are not at a hospital or clinic. Record the average of the two measurements. To check your blood pressure when you are not at a hospital or clinic, you can use: ? An automated blood pressure machine at a pharmacy. ? A home blood pressure monitor.  If  you are between 55 years and 21 years old, ask your health care provider if you should take aspirin to prevent strokes.  Have regular diabetes screenings. This involves taking a blood sample to check your fasting blood sugar level. ? If you are at a normal weight and have a low risk for diabetes, have this test once every three years after 42 years of age. ? If you are overweight and have a high risk for diabetes, consider being tested at a younger age or more often. Preventing infection Hepatitis B  If you have a higher risk for hepatitis B, you should be screened for this virus. You are considered at high risk for hepatitis B if: ? You were born in a country where hepatitis B is common. Ask your health care provider which countries are considered high risk. ? Your parents were born in a high-risk country, and you have not been immunized against hepatitis B (hepatitis B vaccine). ? You have HIV or AIDS. ? You use needles to inject street drugs. ? You live with someone who has hepatitis B. ? You have had sex with someone who has hepatitis B. ? You get hemodialysis treatment. ? You take certain medicines for conditions, including cancer, organ transplantation, and autoimmune conditions.  Hepatitis C  Blood testing is recommended for: ? Everyone born from [birth year] through 1965. ? Anyone with known risk factors for hepatitis C.  Sexually transmitted infections (STIs)  You should be screened for sexually transmitted infections (STIs) including gonorrhea and chlamydia if: ? You are sexually active and are younger than 42 years of age. ? You are older than 42 years of age and your health care provider tells you that you are at risk for this type of infection. ? Your sexual activity has changed since you were last screened and you are at an increased risk for chlamydia or gonorrhea. Ask your health care provider if you are at risk.  If you do not have HIV, but are at risk, it may be recommended  that you take a prescription medicine daily to prevent HIV infection. This is called pre-exposure prophylaxis (PrEP). You are considered at risk if: ? You are sexually active and do not regularly use condoms or know the HIV status of your partner(s). ? You take drugs by injection. ? You are sexually active with a partner who has HIV.  Talk with your health care provider about whether you are at high risk of being infected with HIV. If you choose to  begin PrEP, you should first be tested for HIV. You should then be tested every 3 months for as long as you are taking PrEP. Pregnancy  If you are premenopausal and you may become pregnant, ask your health care provider about preconception counseling.  If you may become pregnant, take 400 to 800 micrograms (mcg) of folic acid every day.  If you want to prevent pregnancy, talk to your health care provider about birth control (contraception). Osteoporosis and menopause  Osteoporosis is a disease in which the bones lose minerals and strength with aging. This can result in serious bone fractures. Your risk for osteoporosis can be identified using a bone density scan.  If you are 65 years of age or older, or if you are at risk for osteoporosis and fractures, ask your health care provider if you should be screened.  Ask your health care provider whether you should take a calcium or vitamin D supplement to lower your risk for osteoporosis.  Menopause may have certain physical symptoms and risks.  Hormone replacement therapy may reduce some of these symptoms and risks. Talk to your health care provider about whether hormone replacement therapy is right for you. Follow these instructions at home:  Schedule regular health, dental, and eye exams.  Stay current with your immunizations.  Do not use any tobacco products including cigarettes, chewing tobacco, or electronic cigarettes.  If you are pregnant, do not drink alcohol.  If you are  breastfeeding, limit how much and how often you drink alcohol.  Limit alcohol intake to no more than 1 drink per day for nonpregnant women. One drink equals 12 ounces of beer, 5 ounces of wine, or 1 ounces of hard liquor.  Do not use street drugs.  Do not share needles.  Ask your health care provider for help if you need support or information about quitting drugs.  Tell your health care provider if you often feel depressed.  Tell your health care provider if you have ever been abused or do not feel safe at home. This information is not intended to replace advice given to you by your health care provider. Make sure you discuss any questions you have with your health care provider. Document Released: 12/30/2010 Document Revised: 11/22/2015 Document Reviewed: 03/20/2015 Elsevier Interactive Patient Education  2018 [employer]  

## 2018-01-06 NOTE — Progress Notes (Signed)
I acted as a Neurosurgeon for [employer], PA-C Corky Mull, LPN   Subjective:    Linda Franklin is a 42 y.o. female and is here for a comprehensive physical exam.   HPI  There are no preventive care reminders to display for this patient.  Acute Concerns: None  Chronic Issues: None  Health Maintenance: Immunizations -- UTD Colonoscopy -- N/A Mammogram -- UTD, scheduled 01/2018 PAP -- done 08/01/2016 NILM / HPV Negative Bone Density -- N/A Diet -- snacky healthy foods for lunch, often fast food for lunch (usually healthier choices -- panera, subway, chickfila) Caffeine intake -- unsweetened tea Sleep habits -- struggling with work-life balance, 6-7 hours sleep per night Exercise -- 1-2 jogs during the weekend Weight -- Weight: 130 lb (59 kg)  Mood -- good Weight history: Wt Readings from Last 10 Encounters:  01/06/18 130 lb (59 kg)  02/02/17 132 lb 6.4 oz (60.1 kg)  08/01/16 131 lb (59.4 kg)  12/23/15 125 lb (56.7 kg)  08/15/15 127 lb (57.6 kg)  07/31/15 125 lb (56.7 kg)  07/03/14 136 lb (61.7 kg)  07/13/12 135 lb (61.2 kg)  01/21/12 131 lb 9.6 oz (59.7 kg)  08/24/11 134 lb (60.8 kg)   Patient's last menstrual period was 12/16/2017. Period characteristics: consistent, relatively light Alcohol use: rare glass of wine (social) Tobacco use: none  Depression screen PHQ 2/9 01/06/2018  Decreased Interest 0  Down, Depressed, Hopeless 0  PHQ - 2 Score 0    Other providers/specialists: n/a   PMHx, SurgHx, SocialHx, Medications, and Allergies were reviewed in the Visit Navigator and updated as appropriate.   Past Medical History:  Diagnosis Date  . Abnormal Pap smear of cervix 2001   ? HPV, cryo, normal since     Past Surgical History:  Procedure Laterality Date  . ANTERIOR CRUCIATE LIGAMENT REPAIR Left 2004, 08/2015  . GYNECOLOGIC CRYOSURGERY  2001   Cervix, abnormal pap  . MOUTH SURGERY  1990   teeth exraction for brace     Family History  Problem  Relation Age of Onset  . Hypertension Mother   . Hyperlipidemia Mother   . Heart disease Maternal Grandfather   . Heart attack Maternal Grandfather   . Cancer Paternal Grandmother        brain  . Alcohol abuse Paternal Grandfather   . Heart failure Paternal Grandfather   . Diabetes Paternal Grandfather   . Diabetes Father        Pre-DM  . Breast cancer Neg Hx   . Colon cancer Neg Hx     Social History   Tobacco Use  . Smoking status: Never Smoker  . Smokeless tobacco: Never Used  Substance Use Topics  . Alcohol use: Yes    Comment: occ wine  . Drug use: No    Review of Systems:   Review of Systems  Constitutional: Negative.  Negative for chills, fever, malaise/fatigue and weight loss.  HENT: Negative.  Negative for hearing loss, sinus pain and sore throat.   Eyes: Negative.  Negative for blurred vision.  Respiratory: Negative.  Negative for cough and shortness of breath.   Cardiovascular: Negative.  Negative for chest pain, palpitations and leg swelling.  Gastrointestinal: Negative.  Negative for abdominal pain, constipation, diarrhea, heartburn, nausea and vomiting.  Genitourinary: Negative.  Negative for dysuria, frequency and urgency.  Musculoskeletal: Negative.  Negative for back pain, myalgias and neck pain.  Skin: Negative.  Negative for itching and rash.  Neurological: Negative.  Negative for  dizziness, tingling, seizures, loss of consciousness and headaches.  Endo/Heme/Allergies: Negative.  Negative for polydipsia.  Psychiatric/Behavioral: Negative.  Negative for depression. The patient is not nervous/anxious.     Objective:   BP 110/72 (BP Location: Left Arm, Patient Position: Sitting, Cuff Size: Normal)   Pulse 71   Temp 98.2 F (36.8 C) (Oral)   Ht 5' 5.5" (1.664 m)   Wt 130 lb (59 kg)   LMP 12/16/2017   SpO2 97%   BMI 21.30 kg/m  Body mass index is 21.3 kg/m.   General Appearance:    Alert, cooperative, no distress, appears stated age  Head:     Normocephalic, without obvious abnormality, atraumatic  Eyes:    PERRL, conjunctiva/corneas clear, EOM's intact, fundi    benign, both eyes  Ears:    Normal TM's and external ear canals, both ears  Nose:   Nares normal, septum midline, mucosa normal, no drainage    or sinus tenderness  Throat:   Lips, mucosa, and tongue normal; teeth and gums normal  Neck:   Supple, symmetrical, trachea midline, no adenopathy;    thyroid:  no enlargement/tenderness/nodules; no carotid   bruit or JVD  Back:     Symmetric, no curvature, ROM normal, no CVA tenderness  Lungs:     Clear to auscultation bilaterally, respirations unlabored  Chest Wall:    No tenderness or deformity   Heart:    Regular rate and rhythm, S1 and S2 normal, no murmur, rub   or gallop  Breast Exam:    No tenderness, masses, or nipple abnormality  Abdomen:     Soft, non-tender, bowel sounds active all four quadrants,    no masses, no organomegaly  Genitalia:    Deferred  Extremities:   Extremities normal, atraumatic, no cyanosis or edema  Pulses:   2+ and symmetric all extremities  Skin:   Skin, texture, turgor normal, no rashes or lesions; scattered patches of hypopigmented skin along upper neck and back  Lymph nodes:   Cervical, supraclavicular, and axillary nodes normal  Neurologic:   CNII-XII intact, normal strength, sensation and reflexes    throughout    Assessment/Plan:   Linda Franklin was seen today for annual exam.  Diagnoses and all orders for this visit:  Routine physical examination Today patient counseled on age appropriate routine health concerns for screening and prevention, each reviewed and up to date or declined. Immunizations reviewed and up to date or declined. Labs ordered and reviewed. Risk factors for depression reviewed and negative. Hearing function and visual acuity are intact. ADLs screened and addressed as needed. Functional ability and level of safety reviewed and appropriate. Education, counseling and referrals  performed based on assessed risks today. Patient provided with a copy of personalized plan for preventive services. -     CBC -     Comprehensive metabolic panel  Encounter for lipid screening for cardiovascular disease -     Cancel: Lipid panel -     Lipid panel    Well Adult Exam: Labs ordered: Yes. Patient counseling was done. See below for items discussed. Discussed the patient's BMI.  The BMI BMI is in the acceptable range Follow up as needed for acute illness. Breast cancer screening: UTD, planning to get done next month. Cervical cancer screening: UTD   Patient Counseling: [x]    Nutrition: Stressed importance of moderation in sodium/caffeine intake, saturated fat and cholesterol, caloric balance, sufficient intake of fresh fruits, vegetables, fiber, calcium, iron, and 1 mg of folate supplement  per day (for females capable of pregnancy).  [x]    Stressed the importance of regular exercise.   [x]    Substance Abuse: Discussed cessation/primary prevention of tobacco, alcohol, or other drug use; driving or other dangerous activities under the influence; availability of treatment for abuse.   [x]    Injury prevention: Discussed safety belts, safety helmets, smoke detector, smoking near bedding or upholstery.   [x]    Sexuality: Discussed sexually transmitted diseases, partner selection, use of condoms, avoidance of unintended pregnancy  and contraceptive alternatives.  [x]    Dental health: Discussed importance of regular tooth brushing, flossing, and dental visits.  [x]    Health maintenance and immunizations reviewed. Please refer to Health maintenance section.   CMA or LPN served as scribe during this visit. History, Physical, and Plan performed by medical provider. Documentation and orders reviewed and attested to.   Jarold Motto, PA-C [facility]

## 2018-01-07 ENCOUNTER — Encounter: Payer: Self-pay | Admitting: Physician Assistant

## 2018-01-07 LAB — COMPREHENSIVE METABOLIC PANEL
ALBUMIN: 4.4 g/dL (ref 3.5–5.2)
ALK PHOS: 35 U/L — AB (ref 39–117)
ALT: 8 U/L (ref 0–35)
AST: 13 U/L (ref 0–37)
BUN: 10 mg/dL (ref 6–23)
CALCIUM: 9.5 mg/dL (ref 8.4–10.5)
CHLORIDE: 100 meq/L (ref 96–112)
CO2: 27 mEq/L (ref 19–32)
Creatinine, Ser: 0.91 mg/dL (ref 0.40–1.20)
GFR: 71.96 mL/min (ref >60.00)
Glucose, Bld: 93 mg/dL (ref 70–99)
POTASSIUM: 3.7 meq/L (ref 3.5–5.1)
Sodium: 136 mEq/L (ref 135–145)
TOTAL PROTEIN: 6.8 g/dL (ref 6.0–8.3)
Total Bilirubin: 0.6 mg/dL (ref 0.2–1.2)

## 2018-01-07 LAB — LIPID PANEL
Cholesterol: 182 mg/dL (ref 0–200)
HDL: 75 mg/dL (ref >39.00)
LDL Cholesterol: 88 mg/dL (ref 0–99)
NonHDL: 107.11
Total CHOL/HDL Ratio: 2
Triglycerides: 94 mg/dL (ref 0.0–149.0)
VLDL: 18.8 mg/dL (ref 0.0–40.0)

## 2018-01-07 LAB — CBC
HCT: 44.7 % (ref 36.0–46.0)
Hemoglobin: 15.2 g/dL — ABNORMAL HIGH (ref 12.0–15.0)
MCHC: 33.9 g/dL (ref 30.0–36.0)
MCV: 90.9 fl (ref 78.0–100.0)
Platelets: 264 10*3/uL (ref 150.0–400.0)
RBC: 4.91 Mil/uL (ref 3.87–5.11)
RDW: 12.6 % (ref 11.5–15.5)
WBC: 8.2 10*3/uL (ref 4.0–10.5)

## 2018-02-15 ENCOUNTER — Ambulatory Visit
Admission: RE | Admit: 2018-02-15 | Discharge: 2018-02-15 | Disposition: A | Discharge status: Home / Self Care | Payer: BLUE CROSS/BLUE SHIELD | Source: Ambulatory Visit | Attending: Physician Assistant | Admitting: Physician Assistant

## 2018-02-15 DIAGNOSIS — Z1231 Encounter for screening mammogram for malignant neoplasm of breast: Secondary | ICD-10-CM | POA: Diagnosis not present

## 2018-08-29 DIAGNOSIS — Z8619 Personal history of other infectious and parasitic diseases: Secondary | ICD-10-CM

## 2018-08-29 HISTORY — DX: Personal history of other infectious and parasitic diseases: Z86.19

## 2018-09-13 DIAGNOSIS — B029 Zoster without complications: Secondary | ICD-10-CM | POA: Diagnosis not present

## 2018-09-14 ENCOUNTER — Ambulatory Visit: Payer: BLUE CROSS/BLUE SHIELD | Admitting: Physician Assistant

## 2019-01-14 ENCOUNTER — Other Ambulatory Visit: Payer: Self-pay | Admitting: Physician Assistant

## 2019-01-14 DIAGNOSIS — Z1231 Encounter for screening mammogram for malignant neoplasm of breast: Secondary | ICD-10-CM

## 2019-03-02 ENCOUNTER — Ambulatory Visit
Admission: RE | Admit: 2019-03-02 | Discharge: 2019-03-02 | Disposition: A | Discharge status: Home / Self Care | Payer: BLUE CROSS/BLUE SHIELD | Source: Ambulatory Visit | Attending: Physician Assistant | Admitting: Physician Assistant

## 2019-03-02 ENCOUNTER — Other Ambulatory Visit: Payer: Self-pay

## 2019-03-02 DIAGNOSIS — Z1231 Encounter for screening mammogram for malignant neoplasm of breast: Secondary | ICD-10-CM

## 2019-06-21 DIAGNOSIS — Z03818 Encounter for observation for suspected exposure to other biological agents ruled out: Secondary | ICD-10-CM | POA: Diagnosis not present

## 2019-10-06 ENCOUNTER — Encounter: Payer: Self-pay | Admitting: Physician Assistant

## 2019-10-25 ENCOUNTER — Encounter: Payer: Self-pay | Admitting: Physician Assistant

## 2019-10-25 ENCOUNTER — Other Ambulatory Visit: Payer: Self-pay

## 2019-10-25 ENCOUNTER — Ambulatory Visit (INDEPENDENT_AMBULATORY_CARE_PROVIDER_SITE_OTHER): Payer: BLUE CROSS/BLUE SHIELD | Admitting: Physician Assistant

## 2019-10-25 VITALS — BP 118/80 | HR 62 | Temp 97.8°F | Ht 66.0 in | Wt 134.4 lb

## 2019-10-25 DIAGNOSIS — Z13220 Encounter for screening for lipoid disorders: Secondary | ICD-10-CM | POA: Diagnosis not present

## 2019-10-25 DIAGNOSIS — Z0001 Encounter for general adult medical examination with abnormal findings: Secondary | ICD-10-CM | POA: Diagnosis not present

## 2019-10-25 DIAGNOSIS — R21 Rash and other nonspecific skin eruption: Secondary | ICD-10-CM

## 2019-10-25 DIAGNOSIS — Z23 Encounter for immunization: Secondary | ICD-10-CM

## 2019-10-25 DIAGNOSIS — Z136 Encounter for screening for cardiovascular disorders: Secondary | ICD-10-CM | POA: Diagnosis not present

## 2019-10-25 LAB — CBC WITH DIFFERENTIAL/PLATELET
Basophils Absolute: 0 10*3/uL (ref 0.0–0.1)
Basophils Relative: 0.5 % (ref 0.0–3.0)
Eosinophils Absolute: 0.1 10*3/uL (ref 0.0–0.7)
Eosinophils Relative: 1.8 % (ref 0.0–5.0)
HCT: 40.4 % (ref 36.0–46.0)
Hemoglobin: 13.6 g/dL (ref 12.0–15.0)
Lymphocytes Relative: 34.8 % (ref 12.0–46.0)
Lymphs Abs: 1.9 10*3/uL (ref 0.7–4.0)
MCHC: 33.6 g/dL (ref 30.0–36.0)
MCV: 90 fl (ref 78.0–100.0)
Monocytes Absolute: 0.5 10*3/uL (ref 0.1–1.0)
Monocytes Relative: 9.2 % (ref 3.0–12.0)
Neutro Abs: 3 10*3/uL (ref 1.4–7.7)
Neutrophils Relative %: 53.7 % (ref 43.0–77.0)
Platelets: 261 10*3/uL (ref 150.0–400.0)
RBC: 4.49 Mil/uL (ref 3.87–5.11)
RDW: 12.6 % (ref 11.5–15.5)
WBC: 5.5 10*3/uL (ref 4.0–10.5)

## 2019-10-25 LAB — COMPREHENSIVE METABOLIC PANEL
ALT: 11 U/L (ref 0–35)
AST: 16 U/L (ref 0–37)
Albumin: 4.3 g/dL (ref 3.5–5.2)
Alkaline Phosphatase: 37 U/L — ABNORMAL LOW (ref 39–117)
BUN: 7 mg/dL (ref 6–23)
CO2: 28 mEq/L (ref 19–32)
Calcium: 9.1 mg/dL (ref 8.4–10.5)
Chloride: 104 mEq/L (ref 96–112)
Creatinine, Ser: 0.83 mg/dL (ref 0.40–1.20)
GFR: 74.66 mL/min (ref >60.00)
Glucose, Bld: 94 mg/dL (ref 70–99)
Potassium: 4 mEq/L (ref 3.5–5.1)
Sodium: 138 mEq/L (ref 135–145)
Total Bilirubin: 0.5 mg/dL (ref 0.2–1.2)
Total Protein: 6.6 g/dL (ref 6.0–8.3)

## 2019-10-25 LAB — LIPID PANEL
Cholesterol: 184 mg/dL (ref 0–200)
HDL: 61.8 mg/dL (ref >39.00)
LDL Cholesterol: 104 mg/dL — ABNORMAL HIGH (ref 0–99)
NonHDL: 122.32
Total CHOL/HDL Ratio: 3
Triglycerides: 92 mg/dL (ref 0.0–149.0)
VLDL: 18.4 mg/dL (ref 0.0–40.0)

## 2019-10-25 MED ORDER — TRIAMCINOLONE ACETONIDE 0.025 % EX OINT: 1.0000 "application " | TOPICAL_OINTMENT | Freq: Two times a day (BID) | CUTANEOUS | 0 refills | Status: DC

## 2019-10-25 MED ORDER — KETOCONAZOLE 2 % EX SHAM: 1.0000 "application " | MEDICATED_SHAMPOO | CUTANEOUS | 1 refills | Status: DC

## 2019-10-25 NOTE — Patient Instructions (Addendum)
It was great to see you!  Your labs will be in MyChart later this week for you to review.  Will treat rash if not improved let us know and we will send referral to Dermatology for you.  Triamcinolone is for the rash on your lower legs. Please also start daily antihistamine of your choice.  Ketoconazole shampoo is for your upper back (lightened skin areas)    Health Maintenance, Female Adopting a healthy lifestyle and getting preventive care are important in promoting health and wellness. Ask your health care provider about:  The right schedule for you to have regular tests and exams.  Things you can do on your own to prevent diseases and keep yourself healthy. What should I know about diet, weight, and exercise? Eat a healthy diet   Eat a diet that includes plenty of vegetables, fruits, low-fat dairy products, and lean protein.  Do not eat a lot of foods that are high in solid fats, added sugars, or sodium. Maintain a healthy weight Body mass index (BMI) is used to identify weight problems. It estimates body fat based on height and weight. Your health care provider can help determine your BMI and help you achieve or maintain a healthy weight. Get regular exercise Get regular exercise. This is one of the most important things you can do for your health. Most adults should:  Exercise for at least 150 minutes each week. The exercise should increase your heart rate and make you sweat (moderate-intensity exercise).  Do strengthening exercises at least twice a week. This is in addition to the moderate-intensity exercise.  Spend less time sitting. Even light physical activity can be beneficial. Watch cholesterol and blood lipids Have your blood tested for lipids and cholesterol at 44 years of age, then have this test every 5 years. Have your cholesterol levels checked more often if:  Your lipid or cholesterol levels are high.  You are older than 44 years of age.  You are at high  risk for heart disease. What should I know about cancer screening? Depending on your health history and family history, you may need to have cancer screening at various ages. This may include screening for:  Breast cancer.  Cervical cancer.  Colorectal cancer.  Skin cancer.  Lung cancer. What should I know about heart disease, diabetes, and high blood pressure? Blood pressure and heart disease  High blood pressure causes heart disease and increases the risk of stroke. This is more likely to develop in people who have high blood pressure readings, are of African descent, or are overweight.  Have your blood pressure checked: ? Every 3-5 years if you are 93-65 years of age. ? Every year if you are 25 years old or older. Diabetes Have regular diabetes screenings. This checks your fasting blood sugar level. Have the screening done:  Once every three years after age 49 if you are at a normal weight and have a low risk for diabetes.  More often and at a younger age if you are overweight or have a high risk for diabetes. What should I know about preventing infection? Hepatitis B If you have a higher risk for hepatitis B, you should be screened for this virus. Talk with your health care provider to find out if you are at risk for hepatitis B infection. Hepatitis C Testing is recommended for:  Everyone born from [birth year] through 1965.  Anyone with known risk factors for hepatitis C. Sexually transmitted infections (STIs)  Get screened for STIs,  including gonorrhea and chlamydia, if: ? You are sexually active and are younger than 44 years of age. ? You are older than 44 years of age and your health care provider tells you that you are at risk for this type of infection. ? Your sexual activity has changed since you were last screened, and you are at increased risk for chlamydia or gonorrhea. Ask your health care provider if you are at risk.  Ask your health care provider about whether you  are at high risk for HIV. Your health care provider may recommend a prescription medicine to help prevent HIV infection. If you choose to take medicine to prevent HIV, you should first get tested for HIV. You should then be tested every 3 months for as long as you are taking the medicine. Pregnancy  If you are about to stop having your period (premenopausal) and you may become pregnant, seek counseling before you get pregnant.  Take 400 to 800 micrograms (mcg) of folic acid every day if you become pregnant.  Ask for birth control (contraception) if you want to prevent pregnancy. Osteoporosis and menopause Osteoporosis is a disease in which the bones lose minerals and strength with aging. This can result in bone fractures. If you are 4 years old or older, or if you are at risk for osteoporosis and fractures, ask your health care provider if you should:  Be screened for bone loss.  Take a calcium or vitamin D supplement to lower your risk of fractures.  Be given hormone replacement therapy (HRT) to treat symptoms of menopause. Follow these instructions at home: Lifestyle  Do not use any products that contain nicotine or tobacco, such as cigarettes, e-cigarettes, and chewing tobacco. If you need help quitting, ask your health care provider.  Do not use street drugs.  Do not share needles.  Ask your health care provider for help if you need support or information about quitting drugs. Alcohol use  Do not drink alcohol if: ? Your health care provider tells you not to drink. ? You are pregnant, may be pregnant, or are planning to become pregnant.  If you drink alcohol: ? Limit how much you use to 0-1 drink a day. ? Limit intake if you are breastfeeding.  Be aware of how much alcohol is in your drink. In the [place] one drink equals one 12 oz bottle of beer (355 mL), one 5 oz glass of wine (148 mL), or one 1 oz glass of hard liquor (44 mL). General instructions  Schedule regular  health, dental, and eye exams.  Stay current with your vaccines.  Tell your health care provider if: ? You often feel depressed. ? You have ever been abused or do not feel safe at home. Summary  Adopting a healthy lifestyle and getting preventive care are important in promoting health and wellness.  Follow your health care provider's instructions about healthy diet, exercising, and getting tested or screened for diseases.  Follow your health care provider's instructions on monitoring your cholesterol and blood pressure. This information is not intended to replace advice given to you by your health care provider. Make sure you discuss any questions you have with your health care provider. Document Revised: 06/09/2018 Document Reviewed: 06/09/2018 Elsevier Patient Education  2020 Elsevier Inc.   Tinea Versicolor  Tinea versicolor is a skin infection. It is caused by a type of yeast. It is normal for some yeast to be on your skin, but too much yeast causes this infection. The  infection causes a rash of light or dark patches on your skin. The rash is most common on the chest, back, neck, or upper arms. The infection usually does not cause other problems. If it is treated, it will probably go away in a few weeks. The infection cannot be spread from one person to another (is not contagious). Follow these instructions at home:  Use over-the-counter and prescription medicines only as told by your doctor.  Scrub your skin every day with dandruff shampoo as told by your doctor.  Do not scratch your skin in the rash area.  Avoid places that are hot and humid.  Do not use tanning booths.  Try to avoid sweating a lot. Contact a doctor if:  Your symptoms get worse.  You have a fever.  You have redness, swelling, or pain in the rash area.  You have fluid or blood coming from your rash.  Your rash feels warm to the touch.  You have pus or a bad smell coming from your rash.  Your  rash comes back (recurs) after treatment. Summary  Tinea versicolor is a skin infection. It causes a rash of light or dark patches on your skin.  The rash is most common on the chest, back, neck, or upper arms. This infection usually does not cause other problems.  Use over-the-counter and prescription medicines only as told by your doctor.  If the infection is treated, it will probably go away in a few weeks. This information is not intended to replace advice given to you by your health care provider. Make sure you discuss any questions you have with your health care provider. Document Revised: 05/29/2017 Document Reviewed: 02/16/2017 Elsevier Patient Education  2020 [employer].

## 2019-10-25 NOTE — Progress Notes (Deleted)
Linda Franklin is a 44 y.o. female here for a {New prob or follow up:31724}.  SCRIBE STATEMENT  History of Present Illness:   No chief complaint on file.   HPI  Past Medical History:  Diagnosis Date  . Abnormal Pap smear of cervix 2001   ? HPV, cryo, normal since     Social History   Socioeconomic History  . Marital status: Married    Spouse name: Not on file  . Number of children: Not on file  . Years of education: Not on file  . Highest education level: Not on file  Occupational History  . Not on file  Tobacco Use  . Smoking status: Never Smoker  . Smokeless tobacco: Never Used  Substance and Sexual Activity  . Alcohol use: Yes    Comment: occ wine  . Drug use: No  . Sexual activity: Yes    Birth control/protection: Surgical    Comment: vasectomy  Other Topics Concern  . Not on file  Social History Narrative   [employer] -- works for the Architect projects   Married   2 [place]: love to read and watch movies, good friend groups   Social Determinants of Radio broadcast assistant Strain:   . Difficulty of Paying Living Expenses:   Food Insecurity:   . Worried About Charity fundraiser in the Last Year:   . Arboriculturist in the Last Year:   Transportation Needs:   . Film/video editor (Medical):   Marland Kitchen Lack of Transportation (Non-Medical):   Physical Activity:   . Days of Exercise per Week:   . Minutes of Exercise per Session:   Stress:   . Feeling of Stress :   Social Connections:   . Frequency of Communication with Friends and Family:   . Frequency of Social Gatherings with Friends and Family:   . Attends Religious Services:   . Active Member of Clubs or Organizations:   . Attends Archivist Meetings:   Marland Kitchen Marital Status:   Intimate Partner Violence:   . Fear of Current or Ex-Partner:   . Emotionally Abused:   Marland Kitchen Physically Abused:   . Sexually Abused:     Past Surgical History:  Procedure Laterality Date  .  ANTERIOR CRUCIATE LIGAMENT REPAIR Left 2004, 08/2015  . GYNECOLOGIC CRYOSURGERY  2001   Cervix, abnormal pap  . MOUTH SURGERY  1990   teeth exraction for brace    Family History  Problem Relation Age of Onset  . Hypertension Mother   . Hyperlipidemia Mother   . Heart disease Maternal Grandfather   . Heart attack Maternal Grandfather   . Cancer Paternal Grandmother        brain  . Alcohol abuse Paternal Grandfather   . Heart failure Paternal Grandfather   . Diabetes Paternal Grandfather   . Diabetes Father        Pre-DM  . Breast cancer Neg Hx   . Colon cancer Neg Hx     No Known Allergies  Current Medications:  No current outpatient medications on file.   Review of Systems:   ROS  Vitals:   There were no vitals filed for this visit.   There is no height or weight on file to calculate BMI.  Physical Exam:   Physical Exam Vitals and nursing note reviewed.  Constitutional:      General: She is not in acute distress.    Appearance: She is  well-developed. She is not ill-appearing or toxic-appearing.  Cardiovascular:     Rate and Rhythm: Regular rhythm. Tachycardia present.     Pulses: Normal pulses.     Heart sounds: Normal heart sounds, S1 normal and S2 normal.     Comments: No LE edema Pulmonary:     Effort: Pulmonary effort is normal.     Breath sounds: Normal breath sounds.  Skin:    General: Skin is warm and dry.  Neurological:     Mental Status: She is alert.     GCS: GCS eye subscore is 4. GCS verbal subscore is 5. GCS motor subscore is 6.  Psychiatric:        Speech: Speech normal.        Behavior: Behavior normal. Behavior is cooperative.     Results for orders placed or performed in visit on 01/06/18  CBC  Result Value Ref Range   WBC 8.2 4.0 - 10.5 K/uL   RBC 4.91 3.87 - 5.11 Mil/uL   Platelets 264.0 150.0 - 400.0 K/uL   Hemoglobin 15.2 (H) 12.0 - 15.0 g/dL   HCT 96.0 45.4 - 09.8 %   MCV 90.9 78.0 - 100.0 fl   MCHC 33.9 30.0 - 36.0 g/dL    RDW 11.9 14.7 - 82.9 %  Comprehensive metabolic panel  Result Value Ref Range   Sodium 136 135 - 145 mEq/L   Potassium 3.7 3.5 - 5.1 mEq/L   Chloride 100 96 - 112 mEq/L   CO2 27 19 - 32 mEq/L   Glucose, Bld 93 70 - 99 mg/dL   BUN 10 6 - 23 mg/dL   Creatinine, Ser 5.62 0.40 - 1.20 mg/dL   Total Bilirubin 0.6 0.2 - 1.2 mg/dL   Alkaline Phosphatase 35 (L) 39 - 117 U/L   AST 13 0 - 37 U/L   ALT 8 0 - 35 U/L   Total Protein 6.8 6.0 - 8.3 g/dL   Albumin 4.4 3.5 - 5.2 g/dL   Calcium 9.5 8.4 - 13.0 mg/dL   GFR 86.57 >84.69 mL/min  Lipid panel  Result Value Ref Range   Cholesterol 182 0 - 200 mg/dL   Triglycerides 62.9 0.0 - 149.0 mg/dL   HDL 52.84 >13.24 mg/dL   VLDL 40.1 0.0 - 02.7 mg/dL   LDL Cholesterol 88 0 - 99 mg/dL   Total CHOL/HDL Ratio 2    NonHDL 107.11     Assessment and Plan:   There are no diagnoses linked to this encounter.  . Reviewed expectations re: course of current medical issues. . Discussed self-management of symptoms. . Outlined signs and symptoms indicating need for more acute intervention. . Patient verbalized understanding and all questions were answered. . See orders for this visit as documented in the electronic medical record. . Patient received an After-Visit Summary.  ***  Jarold Motto, PA-C

## 2019-10-25 NOTE — Progress Notes (Signed)
I,Donna Orphanos,acting as a Neurosurgeon for [employer], PA.,have documented all relevant documentation on the behalf of Linda Motto, PA,as directed by  Linda Motto, PA while in the presence of Linda Franklin, Linda Franklin.    Subjective:    Linda Franklin is a 44 y.o. female and is here for a comprehensive physical exam.   HPI  Health Maintenance Due  Topic Date Due  . PAP SMEAR-Modifier  08/02/2019    Acute Concerns: Rash  Pt had a strange body rash started at clavicle and went to arms and legs in March. Resolved except for on bilateral legs, red blotchy itchy rash. Unknown etiology. Pt had something similar a year ago from something outside.  Lightened skin area Has been dx with possible tinea versicolor in the past. Has lightened skin patches on her shoulders and upper back. Feels like this is increasing.  Chronic Issues: None  Health Maintenance: Immunizations -- will give Tdap Colonoscopy -- N/A Mammogram -- due 02/2020 PAP -- pt will come back for pap, she started her menses yesterday. Bone Density -- N/A Diet -- overall health diet Sleep habits -- no concerns Exercise -- running several days a week Weight -- Weight: 134 lb 6.1 oz (61 kg)  Mood -- okay, tough year due to father Dx with stage 4 prostate cancer and the pandemic. Weight history: Wt Readings from Last 10 Encounters:  10/25/19 134 lb 6.1 oz (61 kg)  01/06/18 130 lb (59 kg)  02/02/17 132 lb 6.4 oz (60.1 kg)  08/01/16 131 lb (59.4 kg)  12/23/15 125 lb (56.7 kg)  08/15/15 127 lb (57.6 kg)  07/31/15 125 lb (56.7 kg)  07/03/14 136 lb (61.7 kg)  07/13/12 135 lb (61.2 kg)  01/21/12 131 lb 9.6 oz (59.7 kg)   Patient's last menstrual period was 10/24/2019. Alcohol use: occ wine Tobacco use: Never  Depression screen PHQ 2/9 10/25/2019  Decreased Interest 0  Down, Depressed, Hopeless 0  PHQ - 2 Score 0     Other providers/specialists: Patient Care Team: Linda Franklin, Linda Franklin as PCP - General  (Physician Assistant)    PMHx, SurgHx, SocialHx, Medications, and Allergies were reviewed in the Visit Navigator and updated as appropriate.   Past Medical History:  Diagnosis Date  . Abnormal Pap smear of cervix 2001   ? HPV, cryo, normal since  . History of shingles 08/2018     Past Surgical History:  Procedure Laterality Date  . ANTERIOR CRUCIATE LIGAMENT REPAIR Left 2004, 08/2015  . GYNECOLOGIC CRYOSURGERY  2001   Cervix, abnormal pap  . MOUTH SURGERY  1990   teeth exraction for brace     Family History  Problem Relation Age of Onset  . Hypertension Mother   . Hyperlipidemia Mother   . Heart disease Maternal Grandfather   . Heart attack Maternal Grandfather   . Cancer Paternal Grandmother        brain  . Alcohol abuse Paternal Grandfather   . Heart failure Paternal Grandfather   . Diabetes Paternal Grandfather   . Diabetes Father        Pre-DM  . Prostate cancer Father 76       surgery, chemo, radiation and hormone therapy  . Breast cancer Neg Hx   . Colon cancer Neg Hx     Social History   Tobacco Use  . Smoking status: Never Smoker  . Smokeless tobacco: Never Used  Substance Use Topics  . Alcohol use: Yes    Comment: occ wine  .  Drug use: No    Review of Systems:   Review of Systems  Constitutional: Negative.  Negative for chills, fever, malaise/fatigue and weight loss.  HENT: Negative for hearing loss, sinus pain and sore throat.   Eyes: Negative for blurred vision.  Respiratory: Negative for cough and shortness of breath.   Cardiovascular: Negative for chest pain, palpitations and leg swelling.  Gastrointestinal: Negative for abdominal pain, constipation, diarrhea, heartburn, nausea and vomiting.  Genitourinary: Negative for dysuria, frequency and urgency.  Musculoskeletal: Negative for back pain, myalgias and neck pain.  Skin: Positive for itching and rash.       Bilateral legs, red blotchy and itching.  Neurological: Negative for dizziness,  tingling, seizures, loss of consciousness and headaches.  Endo/Heme/Allergies: Negative for polydipsia.  Psychiatric/Behavioral: Negative for depression. The patient is not nervous/anxious.     Objective:   BP 118/80 (BP Location: Left Arm, Patient Position: Sitting, Cuff Size: Normal)   Pulse 62   Temp 97.8 F (36.6 C) (Temporal)   Ht 5\' 6"  (1.676 m)   Wt 134 lb 6.1 oz (61 kg)   LMP 10/24/2019   SpO2 98%   BMI 21.69 kg/m  Body mass index is 21.69 kg/m.   General Appearance:    Alert, cooperative, no distress, appears stated age  Head:    Normocephalic, without obvious abnormality, atraumatic  Eyes:    PERRL, conjunctiva/corneas clear, EOM's intact, fundi    benign, both eyes  Ears:    Normal TM's and external ear canals, both ears  Nose:   Nares normal, septum midline, mucosa normal, no drainage    or sinus tenderness  Throat:   Lips, mucosa, and tongue normal; teeth and gums normal  Neck:   Supple, symmetrical, trachea midline, no adenopathy;    thyroid:  no enlargement/tenderness/nodules; no carotid   bruit or JVD  Back:     Symmetric, no curvature, ROM normal, no CVA tenderness  Lungs:     Clear to auscultation bilaterally, respirations unlabored  Chest Wall:    No tenderness or deformity   Heart:    Regular rate and rhythm, S1 and S2 normal, no murmur, rub or gallop  Breast Exam:    No tenderness, masses, or nipple abnormality  Abdomen:     Soft, non-tender, bowel sounds active all four quadrants,    no masses, no organomegaly  Genitalia:    Deferred  Extremities:   Extremities normal, atraumatic, no cyanosis or edema Scattered erythematous papules to bilateral lower extremities Patches of reduced pigmentation to upper shoulders and back  Pulses:   2+ and symmetric all extremities  Skin:   Skin color, texture, turgor normal, no rashes or lesions  Lymph nodes:   Cervical, supraclavicular, and axillary nodes normal  Neurologic:   CNII-XII intact, normal strength,  sensation and reflexes    throughout     Assessment/Plan:   Pt was seen today for annual exam  Linda Franklin was seen today for annual exam.  Diagnoses and all orders for this visit:  Encounter for general adult medical examination with abnormal findings Today patient counseled on age appropriate routine health concerns for screening and prevention, each reviewed and up to date or declined. Immunizations reviewed and up to date or declined. Labs ordered and reviewed. Risk factors for depression reviewed and negative. Hearing function and visual acuity are intact. ADLs screened and addressed as needed. Functional ability and level of safety reviewed and appropriate. Education, counseling and referrals performed based on assessed risks today. Patient  provided with a copy of personalized plan for preventive services.  Encounter for lipid screening for cardiovascular disease -     Lipid panel  Need for prophylactic vaccination with combined diphtheria-tetanus-pertussis (DTP) vaccine -     Tdap vaccine greater than or equal to 7yo IM  Rash and nonspecific skin eruption Will update labs to r/o organic etiology. Suspect legs have possible dermatitis. Recommend oral antihistamine of choice and topical triamcinolone -- follow-up if no improvement of symptoms. Suspect shoulders/upper back areas possible tinea versicolor - will trial nizoral shampoo to see if this will help. May need derm referral for further evaluation and mgmt. -     CBC with Differential/Platelet -     Comprehensive metabolic panel  Other orders -     triamcinolone (KENALOG) 0.025 % ointment; Apply 1 application topically 2 (two) times daily. -     ketoconazole (NIZORAL) 2 % shampoo; Apply 1 application topically 2 (two) times a week.    Well Adult Exam: Labs ordered: Yes. Patient counseling was done. See below for items discussed. Discussed the patient's BMI.  The BMI is in the acceptable range Follow up in one year. Breast cancer  screening: scheduled. Cervical cancer screening: pap smear deferred today due to patient is on menstrual cycle will schedule after cycle done.  Patient Counseling: [x]    Nutrition: Stressed importance of moderation in sodium/caffeine intake, saturated fat and cholesterol, caloric balance, sufficient intake of fresh fruits, vegetables, fiber, calcium, iron, and 1 mg of folate supplement per day (for females capable of pregnancy).  [x]    Stressed the importance of regular exercise.   [x]    Substance Abuse: Discussed cessation/primary prevention of tobacco, alcohol, or other drug use; driving or other dangerous activities under the influence; availability of treatment for abuse.   [x]    Injury prevention: Discussed safety belts, safety helmets, smoke detector, smoking near bedding or upholstery.   [x]    Sexuality: Discussed sexually transmitted diseases, partner selection, use of condoms, avoidance of unintended pregnancy  and contraceptive alternatives.  [x]    Dental health: Discussed importance of regular tooth brushing, flossing, and dental visits.  [x]    Health maintenance and immunizations reviewed. Please refer to Health maintenance section.   CMA or LPN served as scribe during this visit. History, Physical, and Plan performed by medical provider. The above documentation has been reviewed and is accurate and complete.   , PA-C [place] [facility]

## 2019-11-02 ENCOUNTER — Other Ambulatory Visit (HOSPITAL_COMMUNITY)
Admission: RE | Admit: 2019-11-02 | Discharge: 2019-11-02 | Disposition: A | Discharge status: Home / Self Care | Payer: BLUE CROSS/BLUE SHIELD | Source: Ambulatory Visit | Attending: Physician Assistant | Admitting: Physician Assistant

## 2019-11-02 ENCOUNTER — Encounter: Payer: Self-pay | Admitting: Physician Assistant

## 2019-11-02 ENCOUNTER — Ambulatory Visit (INDEPENDENT_AMBULATORY_CARE_PROVIDER_SITE_OTHER): Payer: BLUE CROSS/BLUE SHIELD | Admitting: Physician Assistant

## 2019-11-02 VITALS — BP 112/76 | HR 64 | Temp 97.9°F | Ht 66.0 in | Wt 133.6 lb

## 2019-11-02 DIAGNOSIS — Z124 Encounter for screening for malignant neoplasm of cervix: Secondary | ICD-10-CM

## 2019-11-02 NOTE — Progress Notes (Signed)
Linda Franklin is a 44 y.o. female is here for a pap smear.  I acted as a Neurosurgeon for [employer], PA-C [employer], [place]  History of Present Illness:   Chief Complaint  Patient presents with  . Gynecologic Exam    HPI   Pap smear Had CPE on 10/25/19. Was on menses. Is here for routine PAP.  Health Maintenance Due  Topic Date Due  . PAP SMEAR-Modifier  08/02/2019    Past Medical History:  Diagnosis Date  . Abnormal Pap smear of cervix 2001   ? HPV, cryo, normal since  . History of shingles 08/2018     Social History   Socioeconomic History  . Marital status: Married    Spouse name: Not on file  . Number of children: Not on file  . Years of education: Not on file  . Highest education level: Not on file  Occupational History  . Not on file  Tobacco Use  . Smoking status: Never Smoker  . Smokeless tobacco: Never Used  Substance and Sexual Activity  . Alcohol use: Yes    Comment: occ wine  . Drug use: No  . Sexual activity: Yes    Birth control/protection: Surgical    Comment: vasectomy  Other Topics Concern  . Not on file  Social History Narrative   [place] -- works for the [employer] projects   Married   2 kiddos   Fun: love to read and watch movies, good friend groups   Social Determinants of Corporate investment banker Strain:   . Difficulty of Paying Living Expenses:   Food Insecurity:   . Worried About Programme researcher, broadcasting/film/video in the Last Year:   . Barista in the Last Year:   Transportation Needs:   . Freight forwarder (Medical):   Marland Kitchen Lack of Transportation (Non-Medical):   Physical Activity:   . Days of Exercise per Week:   . Minutes of Exercise per Session:   Stress:   . Feeling of Stress :   Social Connections:   . Frequency of Communication with Friends and Family:   . Frequency of Social Gatherings with Friends and Family:   . Attends Religious Services:   . Active Member of Clubs or Organizations:   .  Attends Banker Meetings:   Marland Kitchen Marital Status:   Intimate Partner Violence:   . Fear of Current or Ex-Partner:   . Emotionally Abused:   Marland Kitchen Physically Abused:   . Sexually Abused:     Past Surgical History:  Procedure Laterality Date  . ANTERIOR CRUCIATE LIGAMENT REPAIR Left 2004, 08/2015  . GYNECOLOGIC CRYOSURGERY  2001   Cervix, abnormal pap  . MOUTH SURGERY  1990   teeth exraction for brace    Family History  Problem Relation Age of Onset  . Hypertension Mother   . Hyperlipidemia Mother   . Heart disease Maternal Grandfather   . Heart attack Maternal Grandfather   . Cancer Paternal Grandmother        brain  . Alcohol abuse Paternal Grandfather   . Heart failure Paternal Grandfather   . Diabetes Paternal Grandfather   . Diabetes Father        Pre-DM  . Prostate cancer Father 28       surgery, chemo, radiation and hormone therapy  . Breast cancer Neg Hx   . Colon cancer Neg Hx     PMHx, SurgHx, SocialHx, FamHx, Medications, and Allergies  were reviewed in the Visit Navigator and updated as appropriate.   Patient Active Problem List   Diagnosis Date Noted  . S/P ACL reconstruction 09/10/2015  . Health care maintenance 07/13/2012    Social History   Tobacco Use  . Smoking status: Never Smoker  . Smokeless tobacco: Never Used  Substance Use Topics  . Alcohol use: Yes    Comment: occ wine  . Drug use: No    Current Medications and Allergies:    Current Outpatient Medications:  .  ketoconazole (NIZORAL) 2 % shampoo, Apply 1 application topically 2 (two) times a week., Disp: 120 mL, Rfl: 1 .  triamcinolone (KENALOG) 0.025 % ointment, Apply 1 application topically 2 (two) times daily., Disp: 30 g, Rfl: 0  No Known Allergies  Review of Systems   ROS Negative unless otherwise specified per HPI.  Vitals:   Vitals:   11/02/19 1050  BP: 112/76  Pulse: 64  Temp: 97.9 F (36.6 C)  TempSrc: Temporal  SpO2: 98%  Weight: 133 lb 9.6 oz (60.6 kg)   Height: 5\' 6"  (1.676 m)     Body mass index is 21.56 kg/m.   Physical Exam:    Physical Exam Exam conducted with a chaperone present.  Constitutional:      Appearance: She is well-developed.  HENT:     Head: Normocephalic and atraumatic.  Eyes:     Conjunctiva/sclera: Conjunctivae normal.  Pulmonary:     Effort: Pulmonary effort is normal.  Genitourinary:    Labia:        Right: No rash, tenderness or lesion.        Left: No rash, tenderness or lesion.      Vagina: Normal.     Cervix: Normal.     Uterus: Normal.      Adnexa: Right adnexa normal and left adnexa normal.     Rectum: Normal.  Musculoskeletal:        General: Normal range of motion.     Cervical back: Normal range of motion and neck supple.  Skin:    General: Skin is warm and dry.  Neurological:     Mental Status: She is alert and oriented to person, place, and time.  Psychiatric:        Behavior: Behavior normal.        Thought Content: Thought content normal.        Judgment: Judgment normal.      Assessment and Plan:    Linda Franklin was seen today for gynecologic exam.  Diagnoses and all orders for this visit:  Pap smear for cervical cancer screening -     Cytology - PAP( [place])   PAP obtained. Will result via MyChart.  . Reviewed expectations re: course of current medical issues. . Discussed self-management of symptoms. . Outlined signs and symptoms indicating need for more acute intervention. . Patient verbalized understanding and all questions were answered. . See orders for this visit as documented in the electronic medical record. . Patient received an After Visit Summary.  CMA or LPN served as scribe during this visit. History, Physical, and Plan performed by medical provider. The above documentation has been reviewed and is accurate and complete.   Inda Coke, PA-C , [facility] 11/02/2019  Follow-up: No follow-ups on file.

## 2019-11-03 LAB — CYTOLOGY - PAP: Diagnosis: NEGATIVE

## 2019-12-06 ENCOUNTER — Other Ambulatory Visit: Payer: Self-pay | Admitting: Physician Assistant

## 2019-12-07 MED ORDER — TRIAMCINOLONE ACETONIDE 0.025 % EX OINT: 1.0000 "application " | TOPICAL_OINTMENT | Freq: Two times a day (BID) | CUTANEOUS | 0 refills | Status: DC

## 2020-02-27 ENCOUNTER — Other Ambulatory Visit: Payer: Self-pay | Admitting: Physician Assistant

## 2020-02-27 DIAGNOSIS — Z1231 Encounter for screening mammogram for malignant neoplasm of breast: Secondary | ICD-10-CM

## 2020-03-06 ENCOUNTER — Ambulatory Visit
Admission: RE | Admit: 2020-03-06 | Discharge: 2020-03-06 | Disposition: A | Discharge status: Home / Self Care | Payer: BLUE CROSS/BLUE SHIELD | Source: Ambulatory Visit | Attending: Physician Assistant | Admitting: Physician Assistant

## 2020-03-06 ENCOUNTER — Other Ambulatory Visit: Payer: Self-pay

## 2020-03-06 DIAGNOSIS — Z1231 Encounter for screening mammogram for malignant neoplasm of breast: Secondary | ICD-10-CM | POA: Diagnosis not present

## 2020-03-09 ENCOUNTER — Other Ambulatory Visit: Payer: Self-pay | Admitting: Physician Assistant

## 2020-03-09 DIAGNOSIS — R928 Other abnormal and inconclusive findings on diagnostic imaging of breast: Secondary | ICD-10-CM

## 2020-03-22 ENCOUNTER — Ambulatory Visit
Admission: RE | Admit: 2020-03-22 | Discharge: 2020-03-22 | Disposition: A | Discharge status: Home / Self Care | Payer: BLUE CROSS/BLUE SHIELD | Source: Ambulatory Visit | Attending: Physician Assistant | Admitting: Physician Assistant

## 2020-03-22 ENCOUNTER — Ambulatory Visit: Payer: BLUE CROSS/BLUE SHIELD

## 2020-03-22 ENCOUNTER — Other Ambulatory Visit: Payer: Self-pay

## 2020-03-22 DIAGNOSIS — R928 Other abnormal and inconclusive findings on diagnostic imaging of breast: Secondary | ICD-10-CM

## 2020-03-22 DIAGNOSIS — R922 Inconclusive mammogram: Secondary | ICD-10-CM | POA: Diagnosis not present

## 2020-05-14 ENCOUNTER — Encounter: Payer: Self-pay | Admitting: Physician Assistant

## 2020-05-15 NOTE — Telephone Encounter (Signed)
Hello, can you look into this please.

## 2020-07-16 NOTE — Telephone Encounter (Signed)
Dawn, is there anyway you could help with this?

## 2020-11-19 ENCOUNTER — Telehealth: Payer: Self-pay

## 2020-11-19 NOTE — Telephone Encounter (Signed)
Nurse Assessment Nurse: Caryn Section, RN, Butch Penny Date/Time (Eastern Time): 11/18/2020 1:50:25 PM Confirm and document reason for call. If symptomatic, describe symptoms. ---Caller states that she tested pos for COVID today using a home test. Fully vaccinated. No breathing difficulty, SOB or chest pain/pressure. Pt reports postnasal drip, throat irritation and occasional coughing. No fever, vomiting or diarrhea. Normal PO fluid intake and urination. Does the patient have any new or worsening symptoms? ---Yes Will a triage be completed? ---Yes Related visit to physician within the last 2 weeks? ---No Does the PT have any chronic conditions? (i.e. diabetes, asthma, this includes High risk factors for pregnancy, etc.) ---No Is the patient pregnant or possibly pregnant? (Ask all females between the ages of 84-55) ---No Is this a behavioral health or substance abuse call? ---No Guidelines Guideline Title Affirmed Question Affirmed Notes Nurse Date/Time ([place] Time) COVID-19 - Diagnosed or Suspected [1] COVID-19 diagnosed by positive lab test (e.g., PCR, rapid self-test kit) AND Fisher, RN, Butch Penny 11/18/2020 1:53:40 PM PLEASE NOTE: All timestamps contained within this report are represented as [place] Standard Time. CONFIDENTIALTY NOTICE: This fax transmission is intended only for the addressee. It contains information that is legally privileged, confidential or otherwise protected from use or disclosure. If you are not the intended recipient, you are strictly prohibited from reviewing, disclosing, copying using or disseminating any of this information or taking any action in reliance on or regarding this information. If you have received this fax in error, please notify us immediately by telephone so that we can arrange for its return to [place]. Phone: [phone], Toll-Free: [phone], Fax: [phone] Page: 2 of 2 Call Id: [id number] Guidelines Guideline Title Affirmed Question Affirmed  Notes Nurse Date/Time Eilene Ghazi Time) [2] mild symptoms (e.g., cough, fever, others) AND [5] no complications or SOB Disp. Time Eilene Ghazi Time) Disposition Final User 11/18/2020 1:59:34 PM Home Care Yes Caryn Section, RN, [facility] Disagree/Comply Comply Caller Understands Yes PreDisposition Call Doctor Care Advice Given Per Guideline HOME CARE: * You should be able to treat this at home. GENERAL CARE ADVICE FOR COVID-19 SYMPTOMS: * The symptoms are generally treated the same whether you have COVID-19, influenza or some other respiratory virus. * Cough: Use cough drops. * Feeling dehydrated: Drink extra liquids. If the air in your home is dry, use a humidifier. * Fever: For fever over 101 F (38.3 C), take acetaminophen every 4 to 6 hours (Adults 650 mg) OR ibuprofen every 6 to 8 hours (Adults 400 mg). Before taking any medicine, read all the instructions on the package. Do not take aspirin unless your doctor has prescribed it for you. * Muscle aches, headache, and other pains: Often this comes and goes with the fever. Take acetaminophen every 4 to 6 hours (Adults 650 mg) OR ibuprofen every 6 to 8 hours (Adults 400 mg). Before taking any medicine, read all the instructions on the package. * Sore throat: Try throat lozenges, hard candy or warm chicken broth. PAIN AND FEVER MEDICINES: * For pain or fever relief, take either acetaminophen or ibuprofen. * They are over-the-counter (OTC) drugs that help treat both fever and pain. You can buy them at the drugstore. NO ASPIRIN: * Do not use aspirin for treatment of fever or pain. COVID-19 - HOW TO PROTECT OTHERS - WHEN YOU ARE SICK WITH COVID-19: * STAY HOME A MINIMUM OF 5 DAYS: Home isolation is needed for at least 5 days after the symptoms started. Stay home from school or work if you are sick. Do NOT go to religious  services, child care centers, shopping, or other public places. Do NOT use public transportation (e.g., bus, taxis, ride-sharing). Do NOT  allow any visitors to your home. Leave the house only if you need to seek urgent medical care. * WEAR A MASK FOR 10 DAYS: Wear a well-fitted mask for 10 full days any time you are around others inside your home or in public. Do not go to places where you are unable to wear a mask. * [place] HANDS OFTEN: Wash hands often with soap and water. After coughing or sneezing are important times. If soap and water are not available, use an alcohol-based hand sanitizer with at least 60% alcohol, covering all surfaces of your hands and rubbing them together until they feel dry. Avoid touching your eyes, nose, and mouth with unwashed hands. * CALL AHEAD IF MEDICAL CARE IS NEEDED: If you have a medical appointment, call your doctor's office and tell them you have or may have COVID-19. This will help the office protect themselves and other patients. They will give you directions. CALL BACK IF: * Fever over 103 F (39.4 C) * Fever lasts over 3 days * Fever returns after being gone for 24 hours * Chest pain or difficulty breathing occurs * You become worse CARE ADVICE given per COVID-19 - DIAGNOSED OR SUSPECTED (Adult) guideline

## 2020-11-20 ENCOUNTER — Telehealth: Payer: BLUE CROSS/BLUE SHIELD | Admitting: Family Medicine

## 2020-11-20 NOTE — Telephone Encounter (Signed)
Noted. Pt is scheduled for an appt with  Dr. Selena Batten today.

## 2021-11-24 IMAGING — MG DIGITAL SCREENING BILAT W/ CAD
5 series · 5 of 5 positions shown · non-contrast
Comparison: Previous exam(s).

CLINICAL DATA: Screening.

EXAM:
DIGITAL SCREENING BILATERAL MAMMOGRAM WITH CAD

[L MLO]
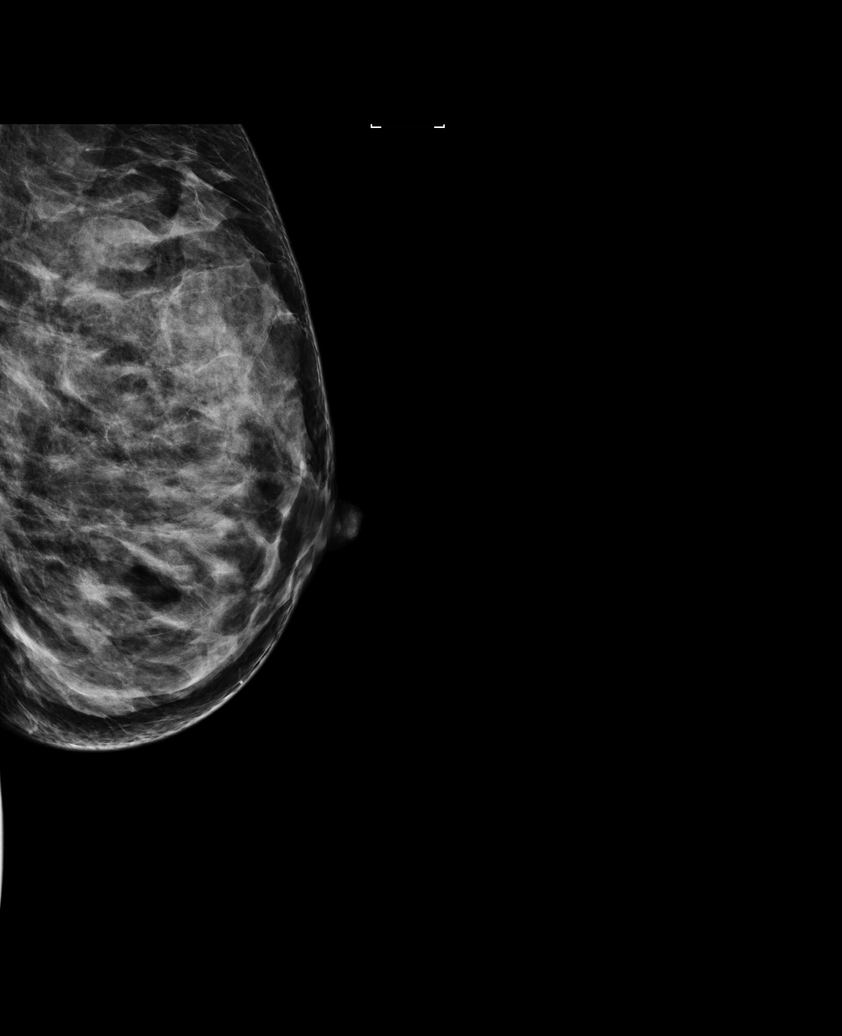

[L CC]
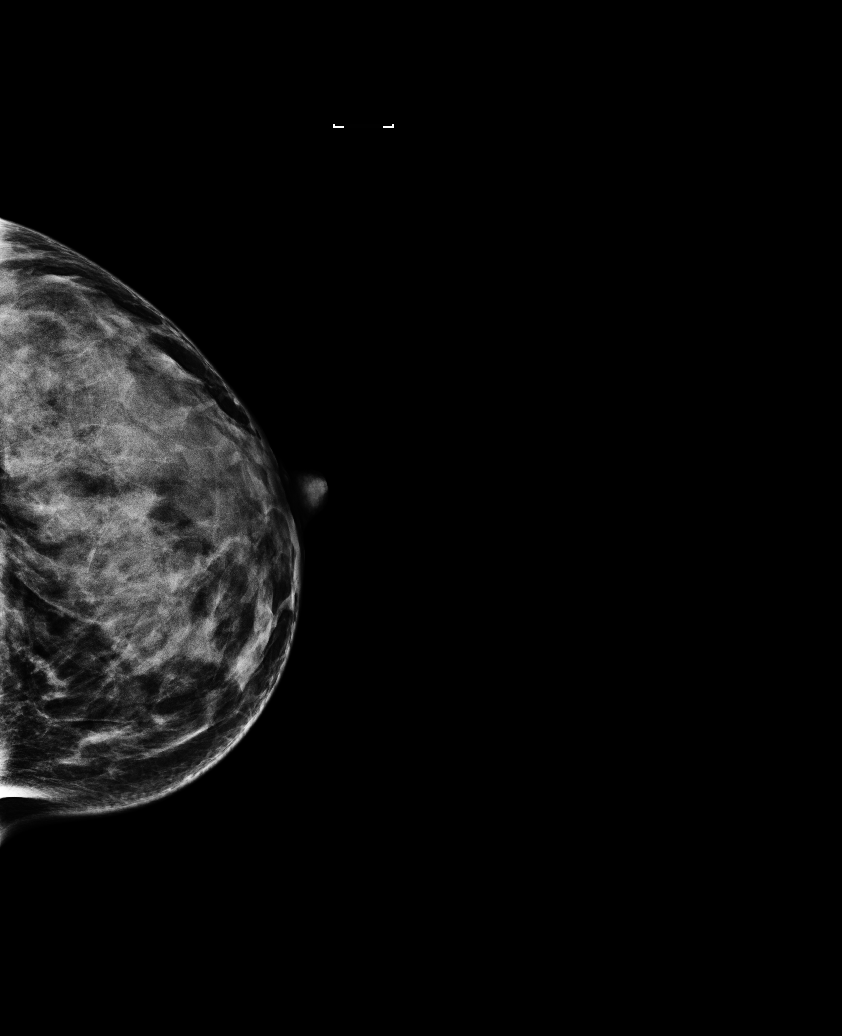

[R MLO (1 of 2)]
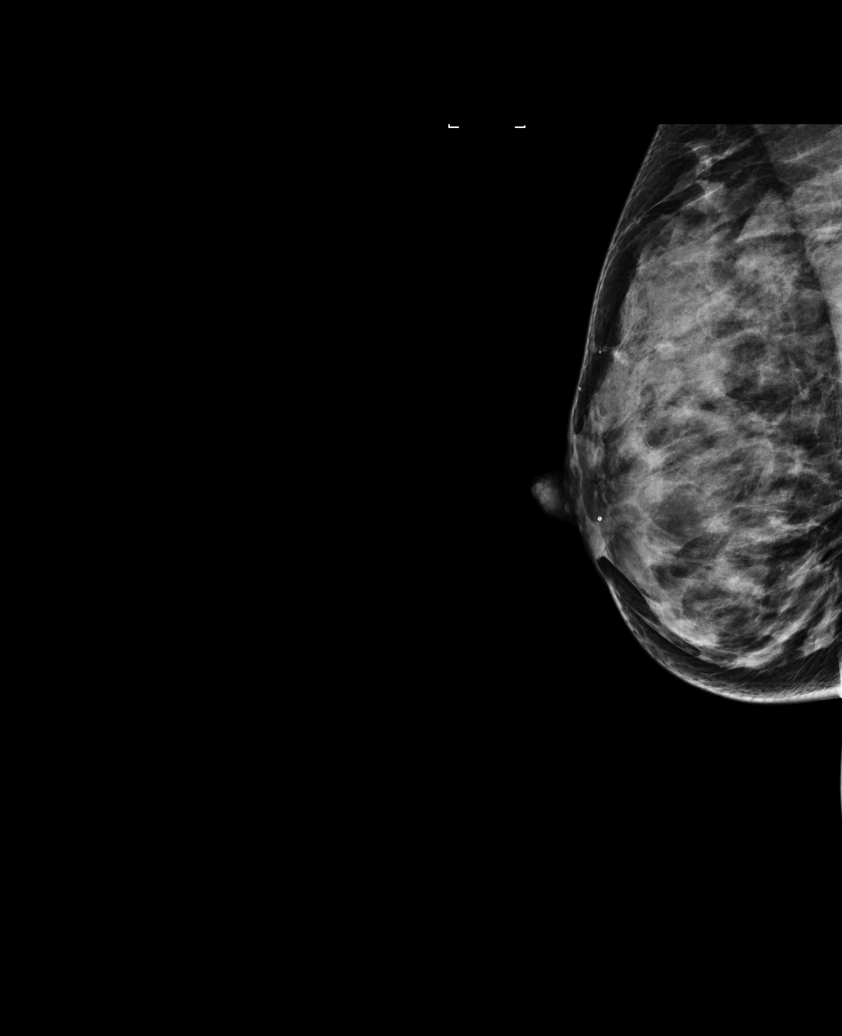

[R MLO (2 of 2)]
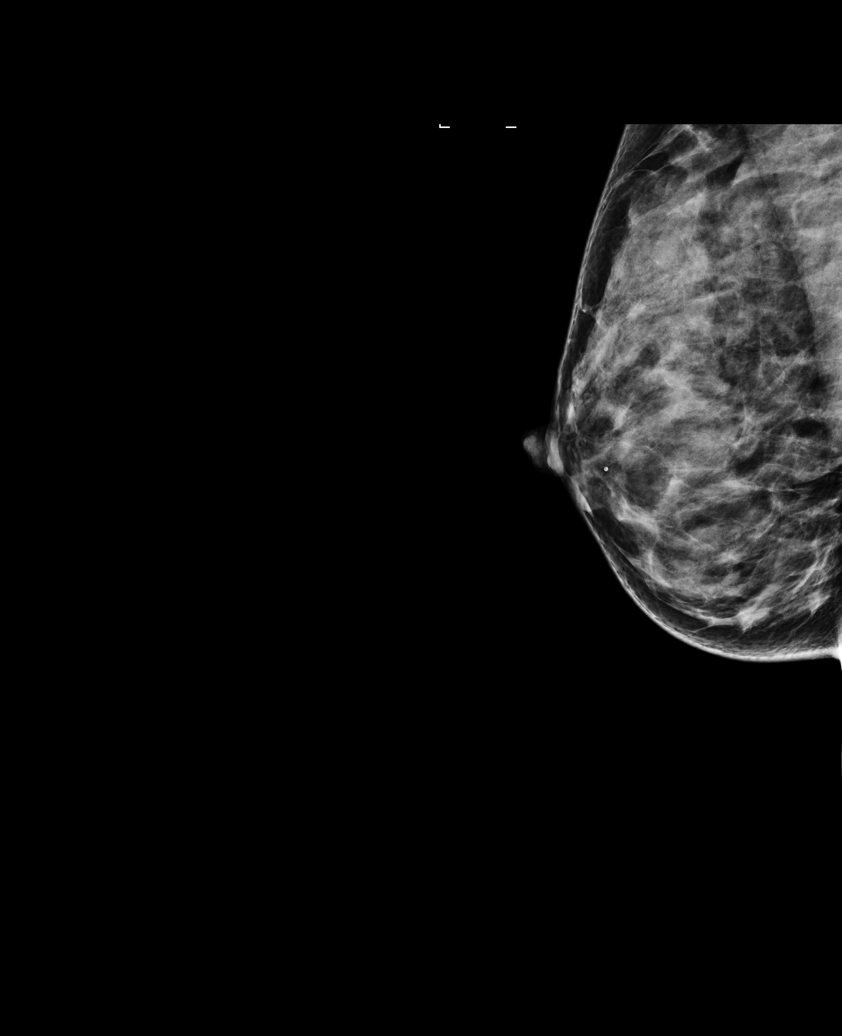

[R CC]
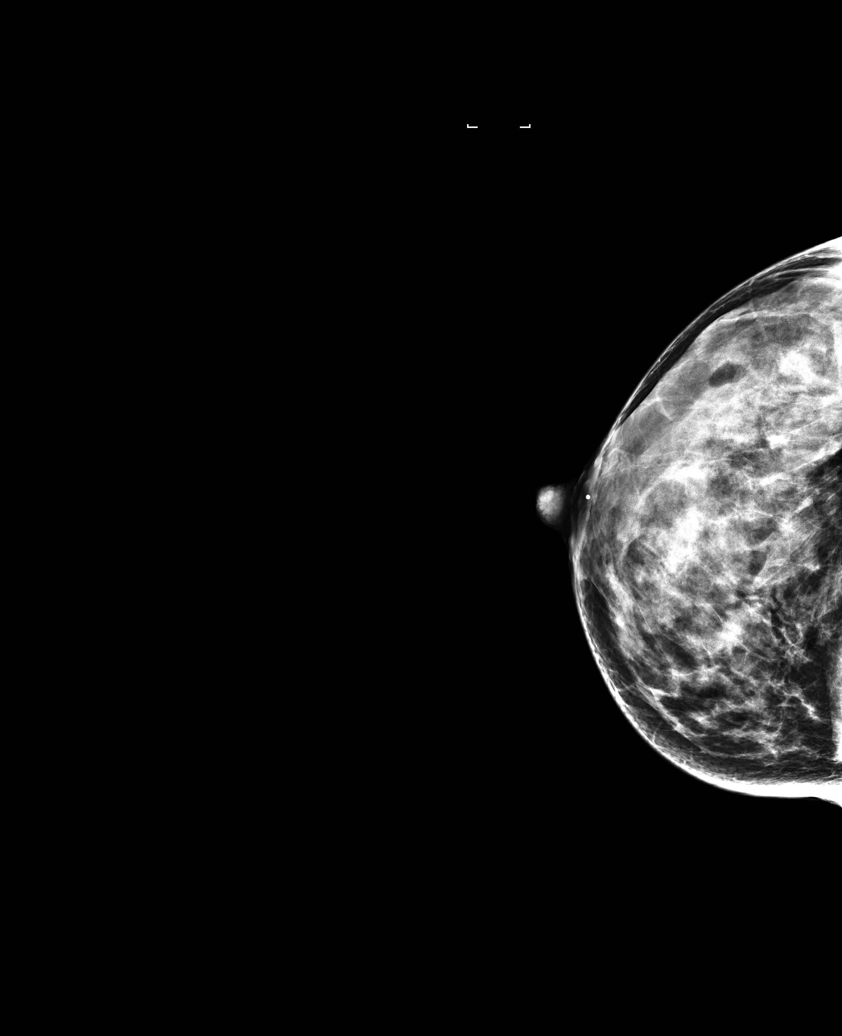

[5 of 5 positions shown; findings below may reference images not displayed]

ACR Breast Density Category d: The breast tissue is extremely dense,
which lowers the sensitivity of mammography.
FINDINGS: In the left breast, a possible asymmetry warrants further
evaluation. In the right breast, no findings suspicious for
malignancy. Images were processed with CAD.
IMPRESSION: Further evaluation is suggested for possible asymmetry in the left
breast.

RECOMMENDATION:
Diagnostic mammogram and possibly ultrasound of the left breast.
(Code:NN-R-CCP)

The patient will be contacted regarding the findings, and additional
imaging will be scheduled.

BI-RADS CATEGORY  0: Incomplete. Need additional imaging evaluation
and/or prior mammograms for comparison.

## 2022-03-24 ENCOUNTER — Encounter: Payer: Self-pay | Admitting: *Deleted

## 2022-03-27 ENCOUNTER — Encounter: Payer: Self-pay | Admitting: Physician Assistant

## 2022-03-27 ENCOUNTER — Ambulatory Visit (INDEPENDENT_AMBULATORY_CARE_PROVIDER_SITE_OTHER): Payer: 59 | Admitting: Physician Assistant

## 2022-03-27 VITALS — BP 104/70 | HR 66 | Temp 97.3°F | Ht 66.0 in | Wt 134.5 lb

## 2022-03-27 DIAGNOSIS — F4320 Adjustment disorder, unspecified: Secondary | ICD-10-CM

## 2022-03-27 DIAGNOSIS — R21 Rash and other nonspecific skin eruption: Secondary | ICD-10-CM

## 2022-03-27 DIAGNOSIS — Z0000 Encounter for general adult medical examination without abnormal findings: Secondary | ICD-10-CM | POA: Diagnosis not present

## 2022-03-27 DIAGNOSIS — L819 Disorder of pigmentation, unspecified: Secondary | ICD-10-CM

## 2022-03-27 DIAGNOSIS — E785 Hyperlipidemia, unspecified: Secondary | ICD-10-CM | POA: Diagnosis not present

## 2022-03-27 DIAGNOSIS — Z1159 Encounter for screening for other viral diseases: Secondary | ICD-10-CM

## 2022-03-27 DIAGNOSIS — Z1211 Encounter for screening for malignant neoplasm of colon: Secondary | ICD-10-CM

## 2022-03-27 LAB — LIPID PANEL
Cholesterol: 189 mg/dL (ref 0–200)
HDL: 73.6 mg/dL (ref >39.00)
LDL Cholesterol: 100 mg/dL — ABNORMAL HIGH (ref 0–99)
NonHDL: 115.28
Total CHOL/HDL Ratio: 3
Triglycerides: 76 mg/dL (ref 0.0–149.0)
VLDL: 15.2 mg/dL (ref 0.0–40.0)

## 2022-03-27 LAB — CBC WITH DIFFERENTIAL/PLATELET
Basophils Absolute: 0.1 10*3/uL (ref 0.0–0.1)
Basophils Relative: 1.2 % (ref 0.0–3.0)
Eosinophils Absolute: 0.1 10*3/uL (ref 0.0–0.7)
Eosinophils Relative: 1.4 % (ref 0.0–5.0)
HCT: 41.8 % (ref 36.0–46.0)
Hemoglobin: 13.8 g/dL (ref 12.0–15.0)
Lymphocytes Relative: 29.6 % (ref 12.0–46.0)
Lymphs Abs: 1.7 10*3/uL (ref 0.7–4.0)
MCHC: 33.1 g/dL (ref 30.0–36.0)
MCV: 89.2 fl (ref 78.0–100.0)
Monocytes Absolute: 0.5 10*3/uL (ref 0.1–1.0)
Monocytes Relative: 8.5 % (ref 3.0–12.0)
Neutro Abs: 3.4 10*3/uL (ref 1.4–7.7)
Neutrophils Relative %: 59.3 % (ref 43.0–77.0)
Platelets: 305 10*3/uL (ref 150.0–400.0)
RBC: 4.68 Mil/uL (ref 3.87–5.11)
RDW: 13.9 % (ref 11.5–15.5)
WBC: 5.7 10*3/uL (ref 4.0–10.5)

## 2022-03-27 LAB — COMPREHENSIVE METABOLIC PANEL
ALT: 12 U/L (ref 0–35)
AST: 22 U/L (ref 0–37)
Albumin: 4.4 g/dL (ref 3.5–5.2)
Alkaline Phosphatase: 34 U/L — ABNORMAL LOW (ref 39–117)
BUN: 11 mg/dL (ref 6–23)
CO2: 24 mEq/L (ref 19–32)
Calcium: 9.9 mg/dL (ref 8.4–10.5)
Chloride: 104 mEq/L (ref 96–112)
Creatinine, Ser: 0.93 mg/dL (ref 0.40–1.20)
GFR: 73.72 mL/min (ref >60.00)
Glucose, Bld: 82 mg/dL (ref 70–99)
Potassium: 5.3 mEq/L — ABNORMAL HIGH (ref 3.5–5.1)
Sodium: 139 mEq/L (ref 135–145)
Total Bilirubin: 0.6 mg/dL (ref 0.2–1.2)
Total Protein: 7 g/dL (ref 6.0–8.3)

## 2022-03-27 MED ORDER — KETOCONAZOLE 2 % EX CREA: TOPICAL_CREAM | CUTANEOUS | 0 refills | Status: DC

## 2022-03-27 MED ORDER — TRIAMCINOLONE ACETONIDE 0.025 % EX OINT: TOPICAL_OINTMENT | CUTANEOUS | 0 refills | Status: DC

## 2022-03-27 NOTE — Progress Notes (Signed)
Subjective:    Linda Franklin is a 46 y.o. female and is here for a comprehensive physical exam.  Health Maintenance Due  Topic Date Due   Hepatitis C Screening  Never done   COLONOSCOPY (Pts 45-8yrs Insurance coverage will need to be confirmed)  Never done   MAMMOGRAM  03/06/2021    Acute Concerns: Rash  Has had rash on the front of her neck for a week or two. Denies new products. Had viral URI at the beginning of this. Has tried moisturizer and exfoliants without relief.  Grieving Father passed away since she was last in our office. She is significantly busy with work, has not had a lot of time to grieve. Looking for a therapist. Denies SI/HI.  Chronic Issues: Hypopigmented areas -- she has white spots on her chest that we evaluated in the past. She didn't have any change to sx with ketoconazole shampoo.  Health Maintenance: Immunizations -- UTD Colonoscopy -- overdue, agreeable to cologuard Mammogram -- overdue -- discussed PAP -- UTD Diet -- overall well balanced Exercise -- jogging daily  Sleep habits -- overall ok Mood -- needs  UTD with dentist? - yes UTD with eye doctor? - yes  Weight history: Wt Readings from Last 10 Encounters:  03/27/22 134 lb 8 oz (61 kg)  11/02/19 133 lb 9.6 oz (60.6 kg)  10/25/19 134 lb 6.1 oz (61 kg)  01/06/18 130 lb (59 kg)  02/02/17 132 lb 6.4 oz (60.1 kg)  08/01/16 131 lb (59.4 kg)  12/23/15 125 lb (56.7 kg)  08/15/15 127 lb (57.6 kg)  07/31/15 125 lb (56.7 kg)  07/03/14 136 lb (61.7 kg)   Body mass index is 21.71 kg/m. Patient's last menstrual period was 03/16/2022 (exact date).  Alcohol use:  reports current alcohol use.  Tobacco use:  Tobacco Use: Low Risk  (03/27/2022)   Patient History    Smoking Tobacco Use: Never    Smokeless Tobacco Use: Never    Passive Exposure: Not on file   Eligible for lung cancer screening? N/a     03/27/2022    8:44 AM  Depression screen PHQ 2/9  Decreased Interest 0  Down,  Depressed, Hopeless 0  PHQ - 2 Score 0     Other providers/specialists: Patient Care Team: Inda Coke, [place] as PCP - General (Physician Assistant)    PMHx, SurgHx, SocialHx, Medications, and Allergies were reviewed in the Visit Navigator and updated as appropriate.   Past Medical History:  Diagnosis Date   Abnormal Pap smear of cervix 2001   ? HPV, cryo, normal since   History of shingles 08/2018     Past Surgical History:  Procedure Laterality Date   ANTERIOR CRUCIATE LIGAMENT REPAIR Left 2004, 08/2015   GYNECOLOGIC CRYOSURGERY  2001   Cervix, abnormal pap   MOUTH SURGERY  1990   teeth exraction for brace     Family History  Problem Relation Age of Onset   Hypertension Mother    Hyperlipidemia Mother    Diabetes Father        Pre-DM   Prostate cancer Father 31       surgery, chemo, radiation and hormone therapy   Heart disease Maternal Grandfather    Heart attack Maternal Grandfather    Cancer Paternal Grandmother        brain   Alcohol abuse Paternal Grandfather    Heart failure Paternal Grandfather    Diabetes Paternal Grandfather    Breast cancer Neg Hx  Colon cancer Neg Hx     Social History   Tobacco Use   Smoking status: Never   Smokeless tobacco: Never  Vaping Use   Vaping Use: Never used  Substance Use Topics   Alcohol use: Yes    Comment: occ wine   Drug use: No    Review of Systems:   Review of Systems  Constitutional:  Negative for chills, fever, malaise/fatigue and weight loss.  HENT:  Negative for hearing loss, sinus pain and sore throat.   Respiratory:  Negative for cough and hemoptysis.   Cardiovascular:  Negative for chest pain, palpitations, leg swelling and PND.  Gastrointestinal:  Negative for abdominal pain, constipation, diarrhea, heartburn, nausea and vomiting.  Genitourinary:  Negative for dysuria, frequency and urgency.  Musculoskeletal:  Negative for back pain, myalgias and neck pain.  Skin:  Negative for itching  and rash.  Neurological:  Negative for dizziness, tingling, seizures and headaches.  Endo/Heme/Allergies:  Negative for polydipsia.  Psychiatric/Behavioral:  Negative for depression. The patient is not nervous/anxious.     Objective:   BP 104/70 (BP Location: Left Arm, Patient Position: Sitting, Cuff Size: Normal)   Pulse 66   Temp (!) 97.3 F (36.3 C) (Temporal)   Ht 5\' 6"  (1.676 m)   Wt 134 lb 8 oz (61 kg)   LMP 03/16/2022 (Exact Date)   SpO2 98%   BMI 21.71 kg/m  Body mass index is 21.71 kg/m.   General Appearance:    Alert, cooperative, no distress, appears stated age  Head:    Normocephalic, without obvious abnormality, atraumatic  Eyes:    PERRL, conjunctiva/corneas clear, EOM's intact, fundi    benign, both eyes  Ears:    Normal TM's and external ear canals, both ears  Nose:   Nares normal, septum midline, mucosa normal, no drainage    or sinus tenderness  Throat:   Lips, mucosa, and tongue normal; teeth and gums normal  Neck:   Supple, symmetrical, trachea midline, no adenopathy;    thyroid:  no enlargement/tenderness/nodules; no carotid   bruit or JVD  Back:     Symmetric, no curvature, ROM normal, no CVA tenderness  Lungs:     Clear to auscultation bilaterally, respirations unlabored  Chest Wall:    No tenderness or deformity   Heart:    Regular rate and rhythm, S1 and S2 normal, no murmur, rub or gallop  Breast Exam:    Deferred  Abdomen:     Soft, non-tender, bowel sounds active all four quadrants,    no masses, no organomegaly  Genitalia:    Deferred  Extremities:   Extremities normal, atraumatic, no cyanosis or edema  Pulses:   2+ and symmetric all extremities  Skin:   Skin color, texture, turgor normal Diffuse erythematous raised plaque to anterior neck and upper chest Well-defined ares of hypo-pigmentation to chest and back  Lymph nodes:   Cervical, supraclavicular, and axillary nodes normal  Neurologic:   CNII-XII intact, normal strength, sensation and  reflexes    throughout    Assessment/Plan:   Routine physical examination Today patient counseled on age appropriate routine health concerns for screening and prevention, each reviewed and up to date or declined. Immunizations reviewed and up to date or declined. Labs ordered and reviewed. Risk factors for depression reviewed and negative. Hearing function and visual acuity are intact. ADLs screened and addressed as needed. Functional ability and level of safety reviewed and appropriate. Education, counseling and referrals performed based on assessed risks  today. Patient provided with a copy of personalized plan for preventive services.  Anticipatory grieving Uncontrolled Referral to talk therapy  Encounter for screening for other viral diseases Completed today  Special screening for malignant neoplasms, colon Agreeable to Cologuard  Hyperlipidemia, unspecified hyperlipidemia type Update lipid panel today and make recommendations accordingly  Rash and nonspecific skin eruption Suspect possible dermatitis Recommend using very basic moisturizers without any added ingredients Trial topical triamcinolone to area Consider dermatology referral if persists  Skin hypopigmentation Possible tinea versicolor Trial topical Nizoral cream Consider dermatology referral if persists   Inda Coke, PA-C [place]

## 2022-03-27 NOTE — Patient Instructions (Signed)
It was great to see you!  *please call and schedule your mammogram *we will update all blood work today *use ketoconazole ointment on white spots *use triamcinolone ointment on red rash *referral placed for therapy *cologuard will be sent to your home  Please go to the lab for blood work.   Our office will call you with your results unless you have chosen to receive results via MyChart.  If your blood work is normal we will follow-up each year for physicals and as scheduled for chronic medical problems.  If anything is abnormal we will treat accordingly and get you in for a follow-up.  Take care,  Aldona Bar

## 2022-03-28 ENCOUNTER — Other Ambulatory Visit: Payer: Self-pay | Admitting: Physician Assistant

## 2022-03-28 DIAGNOSIS — E875 Hyperkalemia: Secondary | ICD-10-CM

## 2022-03-28 LAB — HEPATITIS C ANTIBODY: Hepatitis C Ab: NONREACTIVE

## 2022-03-31 ENCOUNTER — Other Ambulatory Visit (INDEPENDENT_AMBULATORY_CARE_PROVIDER_SITE_OTHER): Payer: 59

## 2022-03-31 ENCOUNTER — Other Ambulatory Visit (HOSPITAL_BASED_OUTPATIENT_CLINIC_OR_DEPARTMENT_OTHER): Payer: Self-pay | Admitting: Physician Assistant

## 2022-03-31 DIAGNOSIS — Z1231 Encounter for screening mammogram for malignant neoplasm of breast: Secondary | ICD-10-CM

## 2022-03-31 DIAGNOSIS — E875 Hyperkalemia: Secondary | ICD-10-CM | POA: Diagnosis not present

## 2022-03-31 LAB — BASIC METABOLIC PANEL
BUN: 9 mg/dL (ref 6–23)
CO2: 25 mEq/L (ref 19–32)
Calcium: 9.4 mg/dL (ref 8.4–10.5)
Chloride: 104 mEq/L (ref 96–112)
Creatinine, Ser: 0.93 mg/dL (ref 0.40–1.20)
GFR: 73.72 mL/min (ref >60.00)
Glucose, Bld: 80 mg/dL (ref 70–99)
Potassium: 3.9 mEq/L (ref 3.5–5.1)
Sodium: 138 mEq/L (ref 135–145)

## 2022-04-04 ENCOUNTER — Ambulatory Visit (HOSPITAL_BASED_OUTPATIENT_CLINIC_OR_DEPARTMENT_OTHER)
Admission: RE | Admit: 2022-04-04 | Discharge: 2022-04-04 | Disposition: A | Discharge status: Home / Self Care | Payer: 59 | Source: Ambulatory Visit | Attending: Physician Assistant | Admitting: Physician Assistant

## 2022-04-04 DIAGNOSIS — Z1231 Encounter for screening mammogram for malignant neoplasm of breast: Secondary | ICD-10-CM | POA: Insufficient documentation

## 2022-04-08 LAB — COLOGUARD: COLOGUARD: NEGATIVE

## 2022-04-13 ENCOUNTER — Other Ambulatory Visit: Payer: Self-pay | Admitting: Physician Assistant

## 2022-04-14 MED ORDER — KETOCONAZOLE 2 % EX CREA: TOPICAL_CREAM | CUTANEOUS | 0 refills | Status: DC

## 2022-05-14 ENCOUNTER — Other Ambulatory Visit: Payer: Self-pay | Admitting: Physician Assistant

## 2022-05-14 MED ORDER — KETOCONAZOLE 2 % EX CREA: TOPICAL_CREAM | CUTANEOUS | 0 refills | Status: DC

## 2023-04-03 ENCOUNTER — Other Ambulatory Visit (HOSPITAL_BASED_OUTPATIENT_CLINIC_OR_DEPARTMENT_OTHER): Payer: Self-pay | Admitting: Physician Assistant

## 2023-04-03 DIAGNOSIS — Z1231 Encounter for screening mammogram for malignant neoplasm of breast: Secondary | ICD-10-CM

## 2023-04-07 ENCOUNTER — Encounter: Payer: Self-pay | Admitting: Physician Assistant

## 2023-04-07 ENCOUNTER — Other Ambulatory Visit (HOSPITAL_COMMUNITY)
Admission: RE | Admit: 2023-04-07 | Discharge: 2023-04-07 | Disposition: A | Discharge status: Home / Self Care | Payer: 59 | Source: Ambulatory Visit | Attending: Physician Assistant | Admitting: Physician Assistant

## 2023-04-07 ENCOUNTER — Ambulatory Visit (INDEPENDENT_AMBULATORY_CARE_PROVIDER_SITE_OTHER): Payer: 59 | Admitting: Physician Assistant

## 2023-04-07 VITALS — BP 110/70 | HR 68 | Temp 97.3°F | Ht 65.25 in | Wt 130.2 lb

## 2023-04-07 DIAGNOSIS — B360 Pityriasis versicolor: Secondary | ICD-10-CM | POA: Diagnosis not present

## 2023-04-07 DIAGNOSIS — Z124 Encounter for screening for malignant neoplasm of cervix: Secondary | ICD-10-CM

## 2023-04-07 DIAGNOSIS — Z0000 Encounter for general adult medical examination without abnormal findings: Secondary | ICD-10-CM

## 2023-04-07 MED ORDER — FLUCONAZOLE 150 MG PO TABS: ORAL_TABLET | ORAL | 0 refills | Status: DC

## 2023-04-07 MED ORDER — KETOCONAZOLE 2 % EX CREA: TOPICAL_CREAM | CUTANEOUS | 0 refills | Status: DC

## 2023-04-07 NOTE — Patient Instructions (Addendum)
It was great to see you!  Tinea versicolor treatment -Take oral diflucan weekly x 4 weeks -Apply ketoconazole to affected area twice daily  Consider vaginal estrogen (estrace) -- look into it and let me know if you'd like this, especially if intercourse becomes painful  Please go to the lab for blood work.   Our office will call you with your results unless you have chosen to receive results via MyChart.  If your blood work is normal we will follow-up each year for physicals and as scheduled for chronic medical problems.  If anything is abnormal we will treat accordingly and get you in for a follow-up.  Take care,  Lelon Mast

## 2023-04-07 NOTE — Progress Notes (Signed)
Linda Franklin is a 47 y.o. female and is here for a comprehensive physical exam.  HPI Health Maintenance Due  Topic Date Due   Cervical Cancer Screening (HPV/Pap Cotest)  11/02/2022   MAMMOGRAM  04/05/2023   Acute Concerns: Irregular Menstrual Cycle & VMS: Reports changes in her menstrual cycle, and has started experiencing hot flashes.  Describes amenorrhea for a couple of months earlier this year; last cycle was normal except for spotting afterward. Menstrual blood has also been drier and rustier.  Chronic Issues: Tinea versicolor Hypopigmented areas affect her neck and chest.  Reports no significant change with Ketoconazole or Triamcinolone topical.   Health Maintenance: Immunizations -- UpToDate  Colonoscopy -- Last done 04/02/22. Results were normal. Repeat in 2026. Mammogram -- Last done 04/04/22. Results were normal. Repeating upcoming Saturday. PAP -- Last done 11/02/19. Results were normal. Repeating this visit. Bone Density -- N/A. Diet -- Healthy overall: mildly excessive caffeine intake. Exercise -- Regular exercise: running   Sleep habits -- Husband snoring and VMS disrupts sleep, managing with Zquil. Mood -- Stable.  UTD with dentist? - Yes UTD with eye doctor? - Yes  Weight history: Wt Readings from Last 10 Encounters:  04/07/23 130 lb 4 oz (59.1 kg)  03/27/22 134 lb 8 oz (61 kg)  11/02/19 133 lb 9.6 oz (60.6 kg)  10/25/19 134 lb 6.1 oz (61 kg)  01/06/18 130 lb (59 kg)  02/02/17 132 lb 6.4 oz (60.1 kg)  08/01/16 131 lb (59.4 kg)  12/23/15 125 lb (56.7 kg)  08/15/15 127 lb (57.6 kg)  07/31/15 125 lb (56.7 kg)   Body mass index is 21.51 kg/m. Patient's last menstrual period was 03/25/2023 (exact date).  Alcohol use:  reports current alcohol use.  Tobacco use:  Tobacco Use: Low Risk  (04/07/2023)   Patient History    Smoking Tobacco Use: Never    Smokeless Tobacco Use: Never    Passive Exposure: Not on file   Eligible for lung cancer screening?  no     04/07/2023    2:53 PM  Depression screen PHQ 2/9  Decreased Interest 0  Down, Depressed, Hopeless 0  PHQ - 2 Score 0     Other providers/specialists: Patient Care Team: Jarold Motto, [place] as PCP - General (Physician Assistant)   PMHx, SurgHx, SocialHx, Medications, and Allergies were reviewed in the Visit Navigator and updated as appropriate.   Past Medical History:  Diagnosis Date   Abnormal Pap smear of cervix 2001   ? HPV, cryo, normal since   History of shingles 08/2018    Past Surgical History:  Procedure Laterality Date   ANTERIOR CRUCIATE LIGAMENT REPAIR Left 2004, 08/2015   GYNECOLOGIC CRYOSURGERY  2001   Cervix, abnormal pap   MOUTH SURGERY  1990   teeth exraction for brace   Family History  Problem Relation Age of Onset   Hypertension Mother    Hyperlipidemia Mother    Diabetes Father        Pre-DM   Prostate cancer Father 57       surgery, chemo, radiation and hormone therapy   Heart disease Maternal Grandfather    Heart attack Maternal Grandfather    Cancer Paternal Grandmother        brain   Alcohol abuse Paternal Grandfather    Heart failure Paternal Grandfather    Diabetes Paternal Grandfather    Breast cancer Neg Hx    Colon cancer Neg Hx    Social History   Tobacco Use  Smoking status: Never   Smokeless tobacco: Never  Vaping Use   Vaping status: Never Used  Substance Use Topics   Alcohol use: Yes    Comment: rare wine/cocktail   Drug use: No   Review of Systems:   Review of Systems  Constitutional:  Negative for chills, fever, malaise/fatigue and weight loss.  HENT:  Negative for hearing loss, sinus pain and sore throat.   Respiratory:  Negative for cough and hemoptysis.   Cardiovascular:  Negative for chest pain, palpitations, leg swelling and PND.  Gastrointestinal:  Negative for abdominal pain, constipation, diarrhea, heartburn, nausea and vomiting.  Genitourinary:  Negative for dysuria, frequency and urgency.   Musculoskeletal:  Negative for back pain, myalgias and neck pain.  Skin:  Negative for itching and rash.  Neurological:  Negative for dizziness, tingling, seizures and headaches.  Endo/Heme/Allergies:  Negative for polydipsia.  Psychiatric/Behavioral:  Negative for depression. The patient is not nervous/anxious.      Objective:   BP 110/70 (BP Location: Left Arm, Patient Position: Sitting, Cuff Size: Normal)   Pulse 68   Temp (!) 97.3 F (36.3 C) (Temporal)   Ht 5' 5.25" (1.657 m)   Wt 130 lb 4 oz (59.1 kg)   LMP 03/25/2023 (Exact Date)   SpO2 98%   BMI 21.51 kg/m  Body mass index is 21.51 kg/m.   General Appearance:    Alert, cooperative, no distress, appears stated age  Head:    Normocephalic, without obvious abnormality, atraumatic  Eyes:    PERRL, conjunctiva/corneas clear, EOM's intact, fundi    benign, both eyes  Ears:    Normal TM's and external ear canals, both ears  Nose:   Nares normal, septum midline, mucosa normal, no drainage    or sinus tenderness  Throat:   Lips, mucosa, and tongue normal; teeth and gums normal  Neck:   Supple, symmetrical, trachea midline, no adenopathy;    thyroid:  no enlargement/tenderness/nodules; no carotid   bruit or JVD  Back:     Symmetric, no curvature, ROM normal, no CVA tenderness  Lungs:     Clear to auscultation bilaterally, respirations unlabored  Chest Wall:    No tenderness or deformity   Heart:    Regular rate and rhythm, S1 and S2 normal, no murmur, rub or gallop  Breast Exam:    Deferred  Abdomen:     Soft, non-tender, bowel sounds active all four quadrants,    no masses, no organomegaly  Genitalia:    Normal female without lesion, discharge or tenderness  Extremities:   Extremities normal, atraumatic, no cyanosis or edema  Pulses:   2+ and symmetric all extremities  Skin:   Skin color, texture, turgor normal, no rashes or lesions  Lymph nodes:   Cervical, supraclavicular, and axillary nodes normal  Neurologic:    CNII-XII intact, normal strength, sensation and reflexes    throughout    Assessment/Plan:   Routine physical examination Today patient counseled on age appropriate routine health concerns for screening and prevention, each reviewed and up to date or declined. Immunizations reviewed and up to date or declined. Labs ordered and reviewed. Risk factors for depression reviewed and negative. Hearing function and visual acuity are intact. ADLs screened and addressed as needed. Functional ability and level of safety reviewed and appropriate. Education, counseling and referrals performed based on assessed risks today. Patient provided with a copy of personalized plan for preventive services.  Pap smear for cervical cancer screening Updated today Consider vaginal estrace --  she will look into this and let me know if desired  Tinea versicolor -Take oral diflucan weekly x 4 weeks -Apply ketoconazole to affected area twice daily Follow-up if new/worsening symptom(s)   I,Emily Lagle,acting as a scribe for [employer], PA.,have documented all relevant documentation on the behalf of Jarold Motto, PA,as directed by  Jarold Motto, PA while in the presence of Jarold Motto, [place].  I, Jarold Motto, [place], have reviewed all documentation for this visit. The documentation on 04/07/23 for the exam, diagnosis, procedures, and orders are all accurate and complete.  Jarold Motto, PA-C  [facility]

## 2023-04-08 LAB — CBC WITH DIFFERENTIAL/PLATELET
Basophils Absolute: 0 10*3/uL (ref 0.0–0.1)
Basophils Relative: 0.6 % (ref 0.0–3.0)
Eosinophils Absolute: 0.1 10*3/uL (ref 0.0–0.7)
Eosinophils Relative: 1.1 % (ref 0.0–5.0)
HCT: 42 % (ref 36.0–46.0)
Hemoglobin: 13.7 g/dL (ref 12.0–15.0)
Lymphocytes Relative: 27.8 % (ref 12.0–46.0)
Lymphs Abs: 1.9 10*3/uL (ref 0.7–4.0)
MCHC: 32.6 g/dL (ref 30.0–36.0)
MCV: 89.9 fL (ref 78.0–100.0)
Monocytes Absolute: 0.5 10*3/uL (ref 0.1–1.0)
Monocytes Relative: 6.8 % (ref 3.0–12.0)
Neutro Abs: 4.4 10*3/uL (ref 1.4–7.7)
Neutrophils Relative %: 63.7 % (ref 43.0–77.0)
Platelets: 312 10*3/uL (ref 150.0–400.0)
RBC: 4.67 Mil/uL (ref 3.87–5.11)
RDW: 13.2 % (ref 11.5–15.5)
WBC: 7 10*3/uL (ref 4.0–10.5)

## 2023-04-08 LAB — LIPID PANEL
Cholesterol: 194 mg/dL (ref 0–200)
HDL: 76.4 mg/dL (ref >39.00)
LDL Cholesterol: 88 mg/dL (ref 0–99)
NonHDL: 118.07
Total CHOL/HDL Ratio: 3
Triglycerides: 149 mg/dL (ref 0.0–149.0)
VLDL: 29.8 mg/dL (ref 0.0–40.0)

## 2023-04-08 LAB — COMPREHENSIVE METABOLIC PANEL
ALT: 9 U/L (ref 0–35)
AST: 17 U/L (ref 0–37)
Albumin: 4.5 g/dL (ref 3.5–5.2)
Alkaline Phosphatase: 35 U/L — ABNORMAL LOW (ref 39–117)
BUN: 9 mg/dL (ref 6–23)
CO2: 29 meq/L (ref 19–32)
Calcium: 9.8 mg/dL (ref 8.4–10.5)
Chloride: 101 meq/L (ref 96–112)
Creatinine, Ser: 0.8 mg/dL (ref 0.40–1.20)
GFR: 87.68 mL/min (ref >60.00)
Glucose, Bld: 91 mg/dL (ref 70–99)
Potassium: 3.8 meq/L (ref 3.5–5.1)
Sodium: 139 meq/L (ref 135–145)
Total Bilirubin: 0.3 mg/dL (ref 0.2–1.2)
Total Protein: 6.6 g/dL (ref 6.0–8.3)

## 2023-04-10 LAB — CYTOLOGY - PAP
Adequacy: ABSENT
Comment: NEGATIVE
Diagnosis: NEGATIVE
High risk HPV: NEGATIVE

## 2023-04-11 ENCOUNTER — Ambulatory Visit (HOSPITAL_BASED_OUTPATIENT_CLINIC_OR_DEPARTMENT_OTHER)
Admission: RE | Admit: 2023-04-11 | Discharge: 2023-04-11 | Disposition: A | Discharge status: Home / Self Care | Payer: 59 | Source: Ambulatory Visit | Attending: Physician Assistant | Admitting: Radiology

## 2023-04-11 DIAGNOSIS — Z1231 Encounter for screening mammogram for malignant neoplasm of breast: Secondary | ICD-10-CM | POA: Insufficient documentation

## 2024-04-11 ENCOUNTER — Encounter: Payer: Self-pay | Admitting: Physician Assistant

## 2024-04-11 ENCOUNTER — Ambulatory Visit: Payer: 59 | Admitting: Physician Assistant

## 2024-04-11 ENCOUNTER — Ambulatory Visit: Payer: Self-pay | Admitting: Physician Assistant

## 2024-04-11 VITALS — BP 108/74 | HR 59 | Ht 65.2 in | Wt 122.0 lb

## 2024-04-11 DIAGNOSIS — Z0000 Encounter for general adult medical examination without abnormal findings: Secondary | ICD-10-CM

## 2024-04-11 DIAGNOSIS — R21 Rash and other nonspecific skin eruption: Secondary | ICD-10-CM

## 2024-04-11 DIAGNOSIS — F4323 Adjustment disorder with mixed anxiety and depressed mood: Secondary | ICD-10-CM | POA: Diagnosis not present

## 2024-04-11 LAB — CBC WITH DIFFERENTIAL/PLATELET
Basophils Absolute: 0 K/uL (ref 0.0–0.1)
Basophils Relative: 0.4 % (ref 0.0–3.0)
Eosinophils Absolute: 0 K/uL (ref 0.0–0.7)
Eosinophils Relative: 0.6 % (ref 0.0–5.0)
HCT: 39.3 % (ref 36.0–46.0)
Hemoglobin: 12.8 g/dL (ref 12.0–15.0)
Lymphocytes Relative: 30.9 % (ref 12.0–46.0)
Lymphs Abs: 1.8 K/uL (ref 0.7–4.0)
MCHC: 32.5 g/dL (ref 30.0–36.0)
MCV: 86.8 fl (ref 78.0–100.0)
Monocytes Absolute: 0.4 K/uL (ref 0.1–1.0)
Monocytes Relative: 7.8 % (ref 3.0–12.0)
Neutro Abs: 3.4 K/uL (ref 1.4–7.7)
Neutrophils Relative %: 60.3 % (ref 43.0–77.0)
Platelets: 305 K/uL (ref 150.0–400.0)
RBC: 4.53 Mil/uL (ref 3.87–5.11)
RDW: 13.8 % (ref 11.5–15.5)
WBC: 5.7 K/uL (ref 4.0–10.5)

## 2024-04-11 LAB — LIPID PANEL
Cholesterol: 192 mg/dL (ref 0–200)
HDL: 77.9 mg/dL (ref >39.00)
LDL Cholesterol: 90 mg/dL (ref 0–99)
NonHDL: 114.54
Total CHOL/HDL Ratio: 2
Triglycerides: 124 mg/dL (ref 0.0–149.0)
VLDL: 24.8 mg/dL (ref 0.0–40.0)

## 2024-04-11 LAB — COMPREHENSIVE METABOLIC PANEL WITH GFR
ALT: 10 U/L (ref 0–35)
AST: 16 U/L (ref 0–37)
Albumin: 4.5 g/dL (ref 3.5–5.2)
Alkaline Phosphatase: 29 U/L — ABNORMAL LOW (ref 39–117)
BUN: 9 mg/dL (ref 6–23)
CO2: 28 meq/L (ref 19–32)
Calcium: 9 mg/dL (ref 8.4–10.5)
Chloride: 104 meq/L (ref 96–112)
Creatinine, Ser: 0.86 mg/dL (ref 0.40–1.20)
GFR: 79.82 mL/min (ref >60.00)
Glucose, Bld: 85 mg/dL (ref 70–99)
Potassium: 3.9 meq/L (ref 3.5–5.1)
Sodium: 139 meq/L (ref 135–145)
Total Bilirubin: 0.4 mg/dL (ref 0.2–1.2)
Total Protein: 6.6 g/dL (ref 6.0–8.3)

## 2024-04-11 MED ORDER — BUSPIRONE HCL 5 MG PO TABS: 5.0000 mg | ORAL_TABLET | Freq: Two times a day (BID) | ORAL | 1 refills | Status: DC | PRN

## 2024-04-11 NOTE — Patient Instructions (Addendum)
 It was great to see you!  1 - [facility] -- I placed referral here 2 - [facility]; [address]; [place], [place] [address] [phone] -- another great option  Please go to the lab for blood work.   Our office will call you with your results unless you have chosen to receive results via MyChart.  If your blood work is normal we will follow-up each year for physicals and as scheduled for chronic medical problems.  If anything is abnormal we will treat accordingly and get you in for a follow-up.  Take care,  Annarose Ouellet

## 2024-04-11 NOTE — Progress Notes (Signed)
 Subjective:    Linda Franklin is a 48 y.o. female and is here for a comprehensive physical exam.  HPI  Health Maintenance Due  Topic Date Due   Hepatitis B Vaccines 19-59 Average Risk (2 of 3 - 19+ 3-dose series) 01/27/2005   Mammogram  04/10/2024    Discussed the use of AI scribe software for clinical note transcription with the patient, who gave verbal consent to proceed.  History of Present Illness   Linda Franklin is a 48 year old female who presents with anxiety and stress related to personal and family issues.  She has experienced significant anxiety and stress for the past eight weeks, primarily due to her husband's recent diagnosis of generalized anxiety disorder and OCD, which has strained her marriage. Accusations of an emotional affair led to marital discord and counseling sessions. She ended the friendship with the colleague, resulting in sadness and a sense of lost independence. Despite improvements in home life, she continues to feel anxiety, fear, guilt, and shame, with symptoms of 'tightness' and poor sleep. Her appetite is reduced, though she maintains regular meals.  Her menstrual periods have become sporadic, with delays but no completely missed months. She tracks her periods and considers stress or perimenopause as possible causes.  She has a history of knee issues, including two ACL surgeries on her left knee. A year ago, her knee began giving out, and recent evaluations suggest a nonfunctioning ACL and possible arthritis, indicating a future need for knee replacement. She is managing with braces for now.  Her mother, who lives in [place] , is in good health and has been sober for three years. She has grown closer to her mother during this stressful period, relying on her for support. She denies unexplained headaches, tingling, tremors, leg swelling, or significant changes in bowel habits. A week-long migraine after travel has resolved. She maintains regular  exercise and denies significant changes in alcohol consumption.        Health Maintenance: Immunizations -- already have flu shot Colonoscopy -- UpToDate with Cologuard; due in 2026 Mammogram -- scheduled for Dec 2025 PAP -- UpToDate Bone Density -- n/a Diet -- overall reduced appetite Exercise -- consistent  Sleep habits -- no major concerns Mood -- see above  UTD with dentist? - yes UTD with eye doctor? - yes  Weight history: Wt Readings from Last 10 Encounters:  04/11/24 122 lb (55.3 kg)  04/07/23 130 lb 4 oz (59.1 kg)  03/27/22 134 lb 8 oz (61 kg)  11/02/19 133 lb 9.6 oz (60.6 kg)  10/25/19 134 lb 6.1 oz (61 kg)  01/06/18 130 lb (59 kg)  02/02/17 132 lb 6.4 oz (60.1 kg)  08/01/16 131 lb (59.4 kg)  12/23/15 125 lb (56.7 kg)  08/15/15 127 lb (57.6 kg)   Body mass index is 20.18 kg/m. No LMP recorded. (Menstrual status: Irregular Periods).  Alcohol use:  reports current alcohol use.  Tobacco use:  Tobacco Use: Low Risk  (04/11/2024)   Patient History    Smoking Tobacco Use: Never    Smokeless Tobacco Use: Never    Passive Exposure: Not on file   Eligible for lung cancer screening? no     04/07/2023    2:53 PM  Depression screen PHQ 2/9  Decreased Interest 0  Down, Depressed, Hopeless 0  PHQ - 2 Score 0     Other providers/specialists: Patient Care Team: Job Lukes, [place] as PCP - General (Physician Assistant)    PMHx, SurgHx,  SocialHx, Medications, and Allergies were reviewed in the Visit Navigator and updated as appropriate.   Past Medical History:  Diagnosis Date   Abnormal Pap smear of cervix 2001   ? HPV, cryo, normal since   History of shingles 08/2018     Past Surgical History:  Procedure Laterality Date   ANTERIOR CRUCIATE LIGAMENT REPAIR Left 2004, 08/2015   GYNECOLOGIC CRYOSURGERY  2001   Cervix, abnormal pap   MOUTH SURGERY  1990   teeth exraction for brace     Family History  Problem Relation Age of Onset   Hypertension  Mother    Hyperlipidemia Mother    Alcohol abuse Mother    Diabetes Father        Pre-DM   Prostate cancer Father 46       surgery, chemo, radiation and hormone therapy   Cancer Father    Heart disease Maternal Grandfather    Heart attack Maternal Grandfather    Cancer Paternal Grandmother        brain   Alcohol abuse Paternal Grandfather    Heart failure Paternal Grandfather    Diabetes Paternal Grandfather    Breast cancer Neg Hx    Colon cancer Neg Hx     Social History   Tobacco Use   Smoking status: Never   Smokeless tobacco: Never  Vaping Use   Vaping status: Never Used  Substance Use Topics   Alcohol use: Yes    Comment: rare wine/cocktail   Drug use: No    Review of Systems:   Review of Systems  Constitutional:  Negative for chills, fever, malaise/fatigue and weight loss.  HENT:  Negative for hearing loss, sinus pain and sore throat.   Respiratory:  Negative for cough and hemoptysis.   Cardiovascular:  Negative for chest pain, palpitations, leg swelling and PND.  Gastrointestinal:  Negative for abdominal pain, constipation, diarrhea, heartburn, nausea and vomiting.  Genitourinary:  Negative for dysuria, frequency and urgency.  Musculoskeletal:  Negative for back pain, myalgias and neck pain.  Skin:  Negative for itching and rash.  Neurological:  Negative for dizziness, tingling, seizures and headaches.  Endo/Heme/Allergies:  Negative for polydipsia.  Psychiatric/Behavioral:  Negative for depression. The patient is not nervous/anxious.     Objective:   BP 108/74   Pulse (!) 59   Ht 5' 5.2 (1.656 m)   Wt 122 lb (55.3 kg)   SpO2 97%   BMI 20.18 kg/m  Body mass index is 20.18 kg/m.   General Appearance:    Alert, cooperative, no distress, appears stated age  Head:    Normocephalic, without obvious abnormality, atraumatic  Eyes:    PERRL, conjunctiva/corneas clear, EOM's intact, fundi    benign, both eyes  Ears:    Normal TM's and external ear  canals, both ears  Nose:   Nares normal, septum midline, mucosa normal, no drainage    or sinus tenderness  Throat:   Lips, mucosa, and tongue normal; teeth and gums normal  Neck:   Supple, symmetrical, trachea midline, no adenopathy;    thyroid:  no enlargement/tenderness/nodules; no carotid   bruit or JVD  Back:     Symmetric, no curvature, ROM normal, no CVA tenderness  Lungs:     Clear to auscultation bilaterally, respirations unlabored  Chest Wall:    No tenderness or deformity   Heart:    Regular rate and rhythm, S1 and S2 normal, no murmur, rub or gallop  Breast Exam:    Deferred  Abdomen:     Soft, non-tender, bowel sounds active all four quadrants,    no masses, no organomegaly  Genitalia:    Deferred   Extremities:   Extremities normal, atraumatic, no cyanosis or edema  Pulses:   2+ and symmetric all extremities  Skin:   Skin color, texture, turgor normal, no rashes or lesions  Lymph nodes:   Cervical, supraclavicular, and axillary nodes normal  Neurologic:   CNII-XII intact, normal strength, sensation and reflexes    throughout    Assessment/Plan:   Assessment and Plan    Adult Wellness Visit Routine visit with no significant health changes. Discussed dental procedures. - Check cholesterol levels. - Perform blood work.     Situational mixed anxiety and depressive disorder Significant anxiety due to personal life changes and husband's health issues. Interested in as-needed medication for severe symptoms. - Prescribed Buspar for as-needed use. - Referred to therapist. - Provided recommendations for two therapists.  Rash and nonspecific skin eruption Persistent white skin spots worsening despite previous treatments. - Refer to dermatology for further evaluation.  Lucie Buttner, PA-C [place] [facility]

## 2024-05-16 ENCOUNTER — Telehealth: Admitting: Physician Assistant

## 2024-05-16 DIAGNOSIS — B9689 Other specified bacterial agents as the cause of diseases classified elsewhere: Secondary | ICD-10-CM | POA: Diagnosis not present

## 2024-05-16 DIAGNOSIS — N760 Acute vaginitis: Secondary | ICD-10-CM | POA: Diagnosis not present

## 2024-05-16 MED ORDER — METRONIDAZOLE 500 MG PO TABS: 500.0000 mg | ORAL_TABLET | Freq: Two times a day (BID) | ORAL | 0 refills | Status: AC

## 2024-05-16 NOTE — Progress Notes (Signed)

## 2024-05-24 ENCOUNTER — Encounter: Payer: Self-pay | Admitting: Physician Assistant

## 2024-05-25 ENCOUNTER — Other Ambulatory Visit: Payer: Self-pay | Admitting: Physician Assistant

## 2024-05-25 DIAGNOSIS — N939 Abnormal uterine and vaginal bleeding, unspecified: Secondary | ICD-10-CM

## 2024-05-30 ENCOUNTER — Other Ambulatory Visit: Payer: Self-pay | Admitting: Physician Assistant

## 2024-05-30 DIAGNOSIS — Z1231 Encounter for screening mammogram for malignant neoplasm of breast: Secondary | ICD-10-CM

## 2024-06-15 ENCOUNTER — Ambulatory Visit (INDEPENDENT_AMBULATORY_CARE_PROVIDER_SITE_OTHER)

## 2024-06-15 DIAGNOSIS — Z1231 Encounter for screening mammogram for malignant neoplasm of breast: Secondary | ICD-10-CM | POA: Diagnosis not present

## 2024-07-06 ENCOUNTER — Ambulatory Visit: Admitting: Nurse Practitioner

## 2024-07-06 ENCOUNTER — Encounter: Payer: Self-pay | Admitting: Nurse Practitioner

## 2024-07-06 VITALS — BP 110/74 | HR 68 | Resp 16

## 2024-07-06 DIAGNOSIS — N930 Postcoital and contact bleeding: Secondary | ICD-10-CM

## 2024-07-06 DIAGNOSIS — N926 Irregular menstruation, unspecified: Secondary | ICD-10-CM | POA: Diagnosis not present

## 2024-07-06 DIAGNOSIS — Z113 Encounter for screening for infections with a predominantly sexual mode of transmission: Secondary | ICD-10-CM | POA: Diagnosis not present

## 2024-07-06 DIAGNOSIS — N898 Other specified noninflammatory disorders of vagina: Secondary | ICD-10-CM | POA: Diagnosis not present

## 2024-07-06 LAB — WET PREP FOR TRICH, YEAST, CLUE

## 2024-07-06 LAB — PREGNANCY, URINE: Preg Test, Ur: NEGATIVE

## 2024-07-06 NOTE — Progress Notes (Signed)
" ° °  Acute Office Visit  Subjective:    Patient ID: Linda Franklin, female    DOB: 10-Jun-1976, 49 y.o.   MRN: [id number]   HPI 49 y.o. [id number] presents today for irregular periods x 6 months. Referred by PCP. Sometimes will skip menses and then bleed for extended periods when she does have menses. LMP 06/18/24. Started bleeding today, very light. Was having hot flashes but these have resolved. Having some anxiety. Has had a very tough year personally and stress has been high. Has bleeding with odor after intercourse. Treated for suspected BV in November. This was an [employer]. Abnormal pap ~25 years ago, had cryosurgery.   Patient's last menstrual period was 07/06/2024 (exact date). Period Duration (Days):  (varies) Period Pattern: (!) Irregular Menstrual Flow: Moderate, Light Menstrual Control: Tampon Dysmenorrhea: (!) Mild Dysmenorrhea Symptoms: Cramping  Review of Systems  Constitutional: Negative.   Genitourinary:  Positive for menstrual problem and vaginal bleeding (With intercourse). Negative for dyspareunia, vaginal discharge and vaginal pain.       Vaginal odor  Psychiatric/Behavioral:  Positive for sleep disturbance. The patient is nervous/anxious.        Objective:    Physical Exam Exam conducted with a chaperone present.  Constitutional:      Appearance: Normal appearance.  Genitourinary:    General: Normal vulva.     Vagina: Normal.     Cervix: Normal.     Uterus: Normal.      Adnexa: Right adnexa normal and left adnexa normal.     BP 110/74   Pulse 68   Resp 16   LMP 07/06/2024 (Exact Date) Comment: last cycle was 06/18/24 & started bleeding today Wt Readings from Last 3 Encounters:  04/11/24 122 lb (55.3 kg)  04/07/23 130 lb 4 oz (59.1 kg)  03/27/22 134 lb 8 oz (61 kg)        Linda Franklin, CMA present as biomedical engineer.   Wet prep negative for pathogens UPT neg  Assessment & Plan:   Problem List Items Addressed This Visit   None Visit Diagnoses        Postcoital bleeding    -  Primary   Relevant Orders   WET PREP FOR TRICH, YEAST, CLUE   US  PELVIS TRANSVAGINAL NON-OB (TV ONLY)     Vaginal odor       Relevant Orders   WET PREP FOR TRICH, YEAST, CLUE     Menstrual changes       Relevant Orders   TSH   US  PELVIS TRANSVAGINAL NON-OB (TV ONLY)   Pregnancy, urine     Screening examination for STD (sexually transmitted disease)       Relevant Orders   C. trachomatis/N. gonorrhoeae RNA      Plan: TSH, STD panel pending. UPT negative. Wet prep negative. Schedule ultrasound.      Linda DELENA Shutter DNP, 9:21 AM 07/06/2024 "

## 2024-07-07 LAB — C. TRACHOMATIS/N. GONORRHOEAE RNA
C. trachomatis RNA, TMA: NOT DETECTED
N. gonorrhoeae RNA, TMA: NOT DETECTED

## 2024-07-07 LAB — TSH: TSH: 1.4 m[IU]/L

## 2024-07-08 ENCOUNTER — Ambulatory Visit: Payer: Self-pay | Admitting: Nurse Practitioner

## 2024-07-26 ENCOUNTER — Other Ambulatory Visit

## 2024-07-26 ENCOUNTER — Other Ambulatory Visit: Admitting: Nurse Practitioner

## 2024-07-27 ENCOUNTER — Ambulatory Visit: Admitting: Nurse Practitioner

## 2024-07-27 ENCOUNTER — Encounter: Payer: Self-pay | Admitting: Nurse Practitioner

## 2024-07-27 ENCOUNTER — Other Ambulatory Visit

## 2024-07-27 VITALS — BP 110/74 | HR 72 | Ht 66.0 in | Wt 121.0 lb

## 2024-07-27 DIAGNOSIS — N926 Irregular menstruation, unspecified: Secondary | ICD-10-CM | POA: Diagnosis not present

## 2024-07-27 DIAGNOSIS — N930 Postcoital and contact bleeding: Secondary | ICD-10-CM

## 2024-07-27 DIAGNOSIS — F419 Anxiety disorder, unspecified: Secondary | ICD-10-CM

## 2024-07-27 MED ORDER — NORETHIN ACE-ETH ESTRAD-FE 1-20 MG-MCG PO TABS: 1.0000 | ORAL_TABLET | Freq: Every day | ORAL | 0 refills | Status: AC

## 2024-07-27 NOTE — Progress Notes (Signed)
" ° °  Acute Office Visit  Subjective:    Patient ID: Linda Franklin, female    DOB: 22-Mar-1976, 49 y.o.   MRN: [id number]   HPI 49 y.o. presents today for ultrasound. Seen 07/07/2023 for irregular periods x 6 months. Sometimes will skip menses and then bleed for extended periods when she does have menses. LMP 07/06/2024, PMP 06/18/2024. Was having hot flashes but these have resolved. Has had a very stressful year. Normal TSH, neg UPT, neg STD screening and wet prep at previous visit. Migraines listed in history but reports never being diagnosed. Has a couple of bad headaches a year, sleeps them off. PCP manages anxiety.   Patient's last menstrual period was 07/06/2024 (exact date). Period Duration (Days): 4 - 7 Period Pattern: (!) Irregular Menstrual Flow: Moderate, Light Menstrual Control: Tampon Menstrual Control Change Freq (Hours): 3-4 Dysmenorrhea: (!) Mild Dysmenorrhea Symptoms: Cramping  Review of Systems  Constitutional: Negative.   Genitourinary:  Positive for menstrual problem and vaginal bleeding (With intercourse).  Psychiatric/Behavioral:  Positive for sleep disturbance. The patient is nervous/anxious.        Objective:    Physical Exam Constitutional:      Appearance: Normal appearance.     BP 110/74 (BP Location: Left Arm, Patient Position: Sitting, Cuff Size: Normal)   Pulse 72   Ht 5' 6 (1.676 m)   Wt 121 lb (54.9 kg)   LMP 07/06/2024 (Exact Date) Comment: last cycle was 06/18/24 & started bleeding today  SpO2 99%   BMI 19.53 kg/m  Wt Readings from Last 3 Encounters:  07/27/24 121 lb (54.9 kg)  04/11/24 122 lb (55.3 kg)  04/07/23 130 lb 4 oz (59.1 kg)        Assessment & Plan:   Problem List Items Addressed This Visit   None Visit Diagnoses       Postcoital bleeding    -  Primary     Menstrual changes       Relevant Medications   norethindrone-ethinyl estradiol-FE (LOESTRIN FE) 1-20 MG-MCG tablet     Anxiety          Vaginal  ultrasound: Anteverted uterus, normal size and shape, heterogeneous uterine pattern with a small cystic area seen in the myometrium (adenomyosis?).  Small intramural fibroid 0.87 cm.  Thin, symmetrical endometrium - 6.52 mm.  No masses or thickening seen, avascular.  Both ovaries normal size and normal follicle pattern and normal perfusion.  No adnexal masses, no free fluid.  Plan: Reviewed ultrasound report. Option to start low dose OCP or HRT. Wants OCPs to help with cycle regulation. Start first day of next period. Declines maintenance anxiety meds. Takes Buspar  as needed, not taking often. Also seeing therapist.   Return if symptoms worsen or fail to improve.  I personally spent a total of 30 minutes in the care of the patient today including preparing to see the patient, counseling and educating, placing orders, documenting clinical information in the EHR, independently interpreting results, and communicating results.   [name] DNP, 9:12 AM 07/27/2024 "

## 2024-11-17 ENCOUNTER — Ambulatory Visit: Admitting: Dermatology

## 2025-04-13 ENCOUNTER — Encounter: Admitting: Physician Assistant
